# Patient Record
Sex: Female | Born: 1950
Health system: Southern US, Community
[De-identification: ages and names within clinical notes are randomized; demographics above are authoritative.]

## PROBLEM LIST (undated history)

## (undated) HISTORY — PX: CATARACT EXTRACTION: SUR2

## (undated) HISTORY — PX: RETINAL DETACHMENT SURGERY: SHX105

## (undated) HISTORY — PX: TONSILLECTOMY: SUR1361

---

## 1998-08-17 ENCOUNTER — Emergency Department (HOSPITAL_COMMUNITY): Admission: EM | Admit: 1998-08-17 | Discharge: 1998-08-17 | Payer: Self-pay | Admitting: Emergency Medicine

## 1999-06-15 ENCOUNTER — Inpatient Hospital Stay (HOSPITAL_COMMUNITY): Admission: RE | Admit: 1999-06-15 | Discharge: 1999-06-22 | Payer: Self-pay | Admitting: *Deleted

## 2004-09-26 ENCOUNTER — Encounter: Payer: Self-pay | Admitting: Internal Medicine

## 2004-09-26 LAB — CONVERTED CEMR LAB

## 2004-10-13 ENCOUNTER — Ambulatory Visit: Payer: Self-pay | Admitting: Internal Medicine

## 2004-10-20 ENCOUNTER — Ambulatory Visit: Payer: Self-pay | Admitting: Internal Medicine

## 2005-01-26 ENCOUNTER — Ambulatory Visit: Payer: Self-pay | Admitting: Internal Medicine

## 2005-01-31 ENCOUNTER — Ambulatory Visit: Payer: Self-pay

## 2005-02-07 ENCOUNTER — Ambulatory Visit: Payer: Self-pay | Admitting: Internal Medicine

## 2005-02-10 ENCOUNTER — Ambulatory Visit: Payer: Self-pay | Admitting: Cardiology

## 2005-02-25 ENCOUNTER — Ambulatory Visit: Payer: Self-pay | Admitting: Internal Medicine

## 2005-08-02 ENCOUNTER — Encounter: Admission: RE | Admit: 2005-08-02 | Discharge: 2005-08-02 | Payer: Self-pay | Admitting: Internal Medicine

## 2005-12-23 ENCOUNTER — Encounter: Admission: RE | Admit: 2005-12-23 | Discharge: 2005-12-23 | Payer: Self-pay | Admitting: Unknown Physician Specialty

## 2006-08-24 ENCOUNTER — Inpatient Hospital Stay (HOSPITAL_COMMUNITY): Admission: AD | Admit: 2006-08-24 | Discharge: 2006-08-30 | Payer: Self-pay | Admitting: Psychiatry

## 2006-08-24 ENCOUNTER — Emergency Department (HOSPITAL_COMMUNITY): Admission: EM | Admit: 2006-08-24 | Discharge: 2006-08-24 | Payer: Self-pay | Admitting: Emergency Medicine

## 2006-08-24 ENCOUNTER — Ambulatory Visit: Payer: Self-pay | Admitting: Psychiatry

## 2006-08-31 ENCOUNTER — Other Ambulatory Visit (HOSPITAL_COMMUNITY): Admission: RE | Admit: 2006-08-31 | Discharge: 2006-09-13 | Payer: Self-pay | Admitting: Psychiatry

## 2006-09-28 ENCOUNTER — Ambulatory Visit (HOSPITAL_COMMUNITY): Payer: Self-pay | Admitting: Psychiatry

## 2006-10-03 ENCOUNTER — Ambulatory Visit: Payer: Self-pay | Admitting: *Deleted

## 2006-10-03 ENCOUNTER — Emergency Department (HOSPITAL_COMMUNITY): Admission: EM | Admit: 2006-10-03 | Discharge: 2006-10-03 | Payer: Self-pay | Admitting: Emergency Medicine

## 2006-10-03 ENCOUNTER — Inpatient Hospital Stay (HOSPITAL_COMMUNITY): Admission: AD | Admit: 2006-10-03 | Discharge: 2006-10-09 | Payer: Self-pay | Admitting: *Deleted

## 2006-10-12 ENCOUNTER — Ambulatory Visit (HOSPITAL_COMMUNITY): Payer: Self-pay | Admitting: Psychiatry

## 2007-03-28 ENCOUNTER — Emergency Department (HOSPITAL_COMMUNITY): Admission: EM | Admit: 2007-03-28 | Discharge: 2007-03-28 | Payer: Self-pay | Admitting: Emergency Medicine

## 2007-07-17 ENCOUNTER — Encounter: Payer: Self-pay | Admitting: Internal Medicine

## 2007-07-17 DIAGNOSIS — R5381 Other malaise: Secondary | ICD-10-CM

## 2007-07-17 DIAGNOSIS — IMO0001 Reserved for inherently not codable concepts without codable children: Secondary | ICD-10-CM

## 2007-07-17 DIAGNOSIS — R5383 Other fatigue: Secondary | ICD-10-CM

## 2007-07-17 DIAGNOSIS — G4700 Insomnia, unspecified: Secondary | ICD-10-CM

## 2007-07-17 DIAGNOSIS — J309 Allergic rhinitis, unspecified: Secondary | ICD-10-CM | POA: Insufficient documentation

## 2007-07-17 DIAGNOSIS — F411 Generalized anxiety disorder: Secondary | ICD-10-CM | POA: Insufficient documentation

## 2007-07-17 DIAGNOSIS — I498 Other specified cardiac arrhythmias: Secondary | ICD-10-CM

## 2007-07-17 DIAGNOSIS — Z8719 Personal history of other diseases of the digestive system: Secondary | ICD-10-CM

## 2010-03-05 ENCOUNTER — Encounter (INDEPENDENT_AMBULATORY_CARE_PROVIDER_SITE_OTHER): Payer: Self-pay | Admitting: *Deleted

## 2010-10-26 NOTE — Letter (Signed)
Summary: Colonoscopy Date Change Letter  [facility]  [address] [place], [place] [address]   Phone: [phone]  Fax: [phone]      March 05, 2010 MRN: [id number]   [facility] [address] [place], [place]  [address]   Dear Ms. MITCHELL,   Previously you were recommended to have a repeat colonoscopy around this time. Your chart was recently reviewed by Dr. Claudette Head of [facility]. Follow up colonoscopy is now recommended in July 2014. This revised recommendation is based on current, nationally recognized guidelines for colorectal cancer screening and polyp surveillance. These guidelines are endorsed by the American Cancer Society, [employer] on Colorectal Cancer as well as numerous other major medical organizations.  Please understand that our recommendation assumes that you do not have any new symptoms such as bleeding, a change in bowel habits, anemia, or significant abdominal discomfort. If you do have any concerning GI symptoms or want to discuss the guideline recommendations, please call to arrange an office visit at your earliest convenience. Otherwise we will keep you in our reminder system and contact you 1-2 months prior to the date listed above to schedule your next colonoscopy.  Thank you,  Judie Petit T. Russella Dar, M.D.  [facility] [phone]

## 2011-02-08 NOTE — Consult Note (Signed)
NAMETALEEN, PROSSER             ACCOUNT NO.:  [id number]   MEDICAL RECORD NO.:  [id number]          PATIENT TYPE:  EMS   LOCATION:  ED                           FACILITY:  [facility]   PHYSICIAN:  Marolyn Hammock. Reynolds, M.D.DATE OF BIRTH:  11/12/1950   DATE OF CONSULTATION:  03/28/2007  DATE OF DISCHARGE:                                 CONSULTATION   EMERGENCY ROOM CONSULTATION   REQUESTING PHYSICIAN:  [facility].   REASON FOR EVALUATION:  Adverse drug reaction.   HISTORY OF PRESENT ILLNESS:  This is the initial emergency room consult  visit for this 60 year old woman with a past medical history, which  includes a psychotic episode for which she was hospitalized at the  [facility] in January.  Her family at the bedside reports that  she was discharged on medications including Risperdal, and over the last  few weeks this has been changed to Abilify.  The patient and family  reports that over the past few weeks she has gradually had more  difficulty with movement.  Her speech has become softer, and she has  developed some tremulousness.  She has also had some stiffness of the  extremities and some cramping in the legs.  She also developed  difficulty swallowing, which has been going on especially over the last  week or two.  These problems became particularly acute today, and she  was sent to the emergency room for evaluation.  Over the last couple of  weeks, her psychiatrist, Dr. Evelene Croon, has been gradually reducing her  Abilify and then she has actually been off the drug for a week.  She was  also started on Benztropine 1 mg b.i.d. a week ago, and the family  reports that this has improved the tremors but has not significantly  improved the stiffness, and if anything she has continued to have  difficulty swallowing, which is worsening a little bit.  The patient  denies any pain except for some occasional leg cramping.  Her moods have  been very stable over the last  couple of months.   PAST MEDICAL HISTORY:  1. The psychosis as above.  2. History of hypothyroidism for which she is on medications.   REVIEW OF SYSTEMS:  No headaches.  Some blurry vision as noted above.  Difficulty swallowing as noted above.  No recent chest pain, shortness  of breath, coughing, abdominal pain, nausea.   MEDICATIONS:  1. Benztropine 1 mg b.i.d.  2. Cymbalta 60 mg daily.  3. Klonopin 1 mg one-half tablet b.i.d. and one tablet q.h.s.  4. Armour-Thyroid.  5. Nexium.  6. She has recently discontinued Abilify as noted above.   PHYSICAL EXAMINATION:  VITAL SIGNS:  Temperature 97.4, blood pressure  119/60, pulse 74, respirations 20, O2 SAT 96% on room air.  GENERAL:  This is a healthy-appearing woman seen in no distress.  HEAD:  Atraumatic, normocephalic, oropharynx benign.  NECK:  Supple without carotid bruits.  HEART:  Regular rate and rhythm without murmurs.  NEUROLOGIC EXAMINATION:  Mental status:  She appears awake and alert.  She is able to answer  orientation questions without difficulty.  Recent  and remote memory are intact.  Concentration and fund of knowledge are  appropriate.  Speech is very soft but not dysarthric, and appropriate in  content.  Cranial nerves II-XII:  Pupils are equal and reactive.  Examination of extraocular movements reveals limited upgaze.  Visual  fields are full to threat.  She has decreased facial expression with  reduced blink, right.  The face and palate do move normally and  symmetrically.  Motor:  Normal bulk, there is increased tone with some  cogwheeling in the upper extremities, particularly on the left.  She has  normal strength in all tested extremity muscles.  Sensation:  Intact to  light touch in all extremities.  Coordination:  Rapid movements were  performed a little bit slowly.  Finger-to-nose performed accurately.  Gait:  She rises easily from the stretcher.  She ambulates with a  shortened stride and a bit of a  festinating gait.  Reflexes are 2+ and  symmetric, toes are downgoing bilaterally.   LABORATORY REVIEW:  Chemistries were remarkable only for a slightly  elevated glucose of 113.  A CBC is unremarkable.  A CT of the head is  personally reviewed and the study is unremarkable.   IMPRESSION:  Extrapyramidal reaction to antipsychotic medications.  She  is still having significant problems even after discontinuation of  medications over the last week and addition of Benztropine.   PLAN:  I discussed the situation with her psychiatrist, Dr. Evelene Croon.  She  is agreeable to undertaking Sinemet therapy.  I placed the patient on  Sinemet 25/100 one-half tablet t.i.d. with meals.  I advised her to  continue with the Cogentin and continue remaining off of the Abilify but  continuing the other psychiatric medications as before.  I asked her to  give me a call at the office in a few days, and the dose can be titrated  up as needed.  If her difficulty swallowing worsens or if she worsens in  any other way, she was advised to come back to the emergency department  for evaluation and possible admission.  She was also advised that at any  point at any time over the long upcoming weekend, she could call the  physician on call at [facility] for further  direction.  I thank you for the consultation.      Michael L. Thad Ranger, M.D.  Electronically Signed     MLR/MEDQ  D:  03/28/2007  T:  03/28/2007  Job:  [id number]   cc:   Milagros Evener, M.D.  Fax: [phone]
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
## 2011-02-11 NOTE — Discharge Summary (Signed)
NAMETANNER, VIGNA             ACCOUNT NO.:  [id number]   MEDICAL RECORD NO.:  [id number]          PATIENT TYPE:  IPS   LOCATION:  0306                          [facility]ILITY[facility]  [facility]   PHYSICIAN:  Anselm Jungling, MD  DATE OF BIRTH:  Aug 21, 1951   DATE OF ADMISSION:  08/24/2006  DATE OF DISCHARGE:  08/30/2006                               DISCHARGE SUMMARY   IDENTIFYING DATA AND REASON FOR ADMISSION:  This was an inpatient  psychiatric admission for Kendra Flynn, a 60 year old female who presented at  the emergency department with various physical complaints, associated  with self-inflicted cutting, a complaint that she just did not feel  right, and inability to contract for safety.  She was accepted for  admission based upon the likelihood of mood disorder and/or psychosis.  She had been hospitalized here seven years ago for reasons that were  unclear.  She had been on Celexa 20 mg.  She had no known history of  drug or alcohol abuse.   Please refer to the admission note for further details pertaining to the  symptoms, circumstances and history that led to her hospitalization.  She was given an initial Axis I diagnosis of depressive psychosis, rule  out.   MEDICAL AND LABORATORY:  The patient was medically and physically  assessed by the psychiatric nurse practitioner, and had been medically  cleared by the emergency department prior to admission to the inpatient  psychiatric service.  She was continued on Protonix 40 mg twice daily  for GERD and Armour Thyroid 30 mg daily for hypothyroidism.  There were  no acute medical issues.   HOSPITAL COURSE:  The patient was admitted to the adult inpatient  psychiatric service.  She presented as a slender woman who appeared  depressed, anxious and withdrawn.  Her responses were latent, brief, and  vague.  She was felt to be a poor historian.  We attempted to involve  her in therapeutic groups and activities.  She was continued on Celexa  20 mg  daily.  Although there were not many symptoms in the form of overt  psychosis or thought disorder presenting, her withdrawal was felt to be  concerning, and felt to be possibly a negative symptom of psychosis.  Because of this, she was begun on a trial of low-dose Risperdal.  With  this, the patient seemed to improve significantly.  By the fourth  hospital day she reported that she was feeling better, eating better,  and having less of a nearly catatonic appearance.  However she still  remained somewhat blunted, flat.  However, she was more verbal and more  spontaneous.  She continued to deny auditory hallucinations and there  were no delusional statements.  Following this, the patient continued to  improve daily with more well-organized, realistic thinking, more smiling  affect, and more ability to participate in the treatment program.  On  the sixth hospital day there was a family meeting involving the patient  and her husband.  In that meeting she denied any suicidal ideation and  indicated that she realized that she needed to relax more and take  her  medications regularly.  Husband indicated that he wanted help the  patient in every way possible.  Husband and the patient discussed the  possibility of her volunteering with the elderly because that seems to  relax the patient and keep her active in the community.  Husband stated  that he would like to have written instructions about the patient's  medications upon discharge and any restrictions the patient may have.  They discussed aftercare with a psychiatrist on an outpatient basis.  They also discussed the possibility of counseling.  They described a  strong support system already in place for the patient.  They reviewed a  suicide prevention and crisis hotline pamphlet.   Following this, the patient was felt to be appropriate for discharge.  She agreed to the following aftercare plan.   AFTERCARE:  The patient was to start the very  next day in the [facility]  intensive outpatient psychiatric program.  There, she will be further  followed by Dr. Dub Mikes, a program psychiatrist, regarding medication.   DISCHARGE MEDICATIONS:  Celexa 20 mg daily, Protonix 40 mg b.i.d.,  Armour thyroid 30 mg daily, Risperdal 1 mg b.i.d., and Ativan 1 mg  b.i.d..   DISCHARGE DIAGNOSES:  AXIS I: Depressive disorder with psychotic  features.  AXIS II: Deferred.  AXIS III: History of gastroesophageal reflux disease and hypothyroidism.  AXIS IV: Stressors, severe.  AXIS V: GAF on discharge 60.      Anselm Jungling, MD  Electronically Signed     SPB/MEDQ  D:  10/04/2006  T:  10/05/2006  Job:  [id number]

## 2011-05-31 ENCOUNTER — Other Ambulatory Visit: Payer: Self-pay | Admitting: Ophthalmology

## 2011-05-31 DIAGNOSIS — H5710 Ocular pain, unspecified eye: Secondary | ICD-10-CM

## 2011-05-31 DIAGNOSIS — S0530XA Ocular laceration without prolapse or loss of intraocular tissue, unspecified eye, initial encounter: Secondary | ICD-10-CM

## 2011-06-01 ENCOUNTER — Ambulatory Visit
Admission: RE | Admit: 2011-06-01 | Discharge: 2011-06-01 | Disposition: A | Discharge status: Home / Self Care | Payer: BC Managed Care – PPO | Source: Ambulatory Visit | Attending: Ophthalmology | Admitting: Ophthalmology

## 2011-06-01 DIAGNOSIS — S0530XA Ocular laceration without prolapse or loss of intraocular tissue, unspecified eye, initial encounter: Secondary | ICD-10-CM

## 2011-06-01 MED ORDER — GADOBENATE DIMEGLUMINE 529 MG/ML IV SOLN
15.0000 mL | Freq: Once | INTRAVENOUS | Status: AC | PRN
  Administered 2011-06-01: 15 mL via INTRAVENOUS

## 2011-07-12 LAB — BASIC METABOLIC PANEL
BUN: 2 — ABNORMAL LOW
CO2: 31
Calcium: 9.7
Chloride: 104
Creatinine, Ser: 0.71
GFR calc Af Amer: >60
GFR calc non Af Amer: >60
Glucose, Bld: 113 — ABNORMAL HIGH
Potassium: 4
Sodium: 141

## 2011-07-12 LAB — CBC
HCT: 40.6
Hemoglobin: 13.8
MCHC: 33.9
MCV: 84.4
Platelets: 261
RBC: 4.81
RDW: 14.3 — ABNORMAL HIGH
WBC: 5.4

## 2012-05-21 ENCOUNTER — Other Ambulatory Visit: Payer: Self-pay | Admitting: Family Medicine

## 2012-05-21 ENCOUNTER — Other Ambulatory Visit (HOSPITAL_COMMUNITY)
Admission: RE | Admit: 2012-05-21 | Discharge: 2012-05-21 | Disposition: A | Discharge status: Home / Self Care | Payer: BC Managed Care – PPO | Source: Ambulatory Visit | Attending: Family Medicine | Admitting: Family Medicine

## 2012-05-21 DIAGNOSIS — Z0000 Encounter for general adult medical examination without abnormal findings: Secondary | ICD-10-CM | POA: Insufficient documentation

## 2012-05-23 ENCOUNTER — Other Ambulatory Visit: Payer: Self-pay | Admitting: Obstetrics and Gynecology

## 2012-05-23 ENCOUNTER — Other Ambulatory Visit: Payer: Self-pay | Admitting: Family Medicine

## 2012-05-23 DIAGNOSIS — Z1231 Encounter for screening mammogram for malignant neoplasm of breast: Secondary | ICD-10-CM

## 2012-06-15 ENCOUNTER — Ambulatory Visit
Admission: RE | Admit: 2012-06-15 | Discharge: 2012-06-15 | Disposition: A | Discharge status: Home / Self Care | Payer: BC Managed Care – PPO | Source: Ambulatory Visit | Attending: Family Medicine | Admitting: Family Medicine

## 2012-06-15 DIAGNOSIS — Z1231 Encounter for screening mammogram for malignant neoplasm of breast: Secondary | ICD-10-CM

## 2013-04-17 ENCOUNTER — Encounter: Payer: Self-pay | Admitting: Gastroenterology

## 2013-04-19 ENCOUNTER — Encounter: Payer: Self-pay | Admitting: Gastroenterology

## 2013-05-28 ENCOUNTER — Ambulatory Visit (AMBULATORY_SURGERY_CENTER): Payer: BC Managed Care – PPO

## 2013-05-28 VITALS — Ht 65.5 in | Wt 185.4 lb

## 2013-05-28 DIAGNOSIS — Z860100 Personal history of colon polyps, unspecified: Secondary | ICD-10-CM

## 2013-05-28 MED ORDER — SOD PICOSULFATE-MAG OX-CIT ACD 10-3.5-12 MG-GM-GM PO PACK: 1.0000 | PACK | Freq: Once | ORAL | Status: DC

## 2013-05-28 NOTE — Addendum Note (Signed)
Addended by: Doristine Church F on: 05/28/2013 03:15 PM   Modules accepted: Orders

## 2013-05-29 ENCOUNTER — Encounter: Payer: Self-pay | Admitting: Gastroenterology

## 2013-06-05 ENCOUNTER — Telehealth: Payer: Self-pay | Admitting: Gastroenterology

## 2013-06-05 NOTE — Telephone Encounter (Signed)
All the bowel preps we use are similar to mag citrate. Mag citrate alone is not acceptable and mag citrate is also a chemical. She will need to pick one of our recommended bowel preps. That all I can offer.

## 2013-06-05 NOTE — Telephone Encounter (Signed)
Pt states that she has per her choice placed her self on an organic diet for her weight , health etc. She is VERY adamant about not wanting to drink a " chemical prep", she doesn't want to put that in her body and she wants to do a special liquid  diet that she will follow that will be 2-3 days  Before the procedure with broths, water, clear liquids ,etc and she ONLY wants to do mag citrate as bowel prep. Pt states she will fall back on epsom salt to cleanse her out if necessary. . She doesn't want to do the chemical prep. Pt states eating organic only x 1 year now, nothing chemical or processed. Pt states she knows what not to eat for this procedure as she had a pre visit  and she just wants a natural alternative to the prep.  Pt states she does want to have the colon done, she is in great health but she will not drink the prepopik or any chemical prep. She states she will not hesitate to cancel this procedure if she cannot do the mag citrate as this should be a patient choice. Please advise.  Thanks, marie Pre visit

## 2013-06-05 NOTE — Telephone Encounter (Signed)
called pt and lm to return call about her prep. ewm 06-05-13  222pm.

## 2013-06-06 NOTE — Telephone Encounter (Signed)
Ms. Kendra Flynn phoned in at 1109 this morning to say after thinking about the prep and the wonderful care M. McCraw had provided her, she was going to proceed on with her procedure as scheduled and use the prep that was prescribed for her, Prep-o-pik. She denied any further questions and said she looked forward to getting this procedure completed and behind her. Informed her that if she had any further questions or concerns to call us and we would be happy to assist her./TE

## 2013-06-12 ENCOUNTER — Ambulatory Visit (AMBULATORY_SURGERY_CENTER): Payer: BC Managed Care – PPO | Admitting: Gastroenterology

## 2013-06-12 ENCOUNTER — Encounter: Payer: Self-pay | Admitting: Gastroenterology

## 2013-06-12 VITALS — BP 115/66 | HR 56 | Temp 97.3°F | Resp 14 | Ht 65.0 in | Wt 185.0 lb

## 2013-06-12 DIAGNOSIS — Z1211 Encounter for screening for malignant neoplasm of colon: Secondary | ICD-10-CM

## 2013-06-12 DIAGNOSIS — Z86010 Personal history of colonic polyps: Secondary | ICD-10-CM

## 2013-06-12 MED ORDER — SODIUM CHLORIDE 0.9 % IV SOLN: 500.0000 mL | INTRAVENOUS | Status: DC

## 2013-06-12 NOTE — Patient Instructions (Addendum)
Impressions/recommendations:  Normal colonoscopy  Repeat colonoscopy in 10 years.  YOU HAD AN ENDOSCOPIC PROCEDURE TODAY AT THE [facility]: Refer to the procedure report that was given to you for any specific questions about what was found during the examination.  If the procedure report does not answer your questions, please call your gastroenterologist to clarify.  If you requested that your care partner not be given the details of your procedure findings, then the procedure report has been included in a sealed envelope for you to review at your convenience later.  YOU SHOULD EXPECT: Some feelings of bloating in the abdomen. Passage of more gas than usual.  Walking can help get rid of the air that was put into your GI tract during the procedure and reduce the bloating. If you had a lower endoscopy (such as a colonoscopy or flexible sigmoidoscopy) you may notice spotting of blood in your stool or on the toilet paper. If you underwent a bowel prep for your procedure, then you may not have a normal bowel movement for a few days.  DIET: Your first meal following the procedure should be a light meal and then it is ok to progress to your normal diet.  A half-sandwich or bowl of soup is an example of a good first meal.  Heavy or fried foods are harder to digest and may make you feel nauseous or bloated.  Likewise meals heavy in dairy and vegetables can cause extra gas to form and this can also increase the bloating.  Drink plenty of fluids but you should avoid alcoholic beverages for 24 hours.  ACTIVITY: Your care partner should take you home directly after the procedure.  You should plan to take it easy, moving slowly for the rest of the day.  You can resume normal activity the day after the procedure however you should NOT DRIVE or use heavy machinery for 24 hours (because of the sedation medicines used during the test).    SYMPTOMS TO REPORT IMMEDIATELY: A gastroenterologist can be reached  at any hour.  During normal business hours, 8:30 AM to 5:00 PM Monday through Friday, call [phone].  After hours and on weekends, please call the GI answering service at [phone] who will take a message and have the physician on call contact you.   Following lower endoscopy (colonoscopy or flexible sigmoidoscopy):  Excessive amounts of blood in the stool  Significant tenderness or worsening of abdominal pains  Swelling of the abdomen that is new, acute  Fever of 100F or higher   FOLLOW UP: If any biopsies were taken you will be contacted by phone or by letter within the next 1-3 weeks.  Call your gastroenterologist if you have not heard about the biopsies in 3 weeks.  Our staff will call the home number listed on your records the next business day following your procedure to check on you and address any questions or concerns that you may have at that time regarding the information given to you following your procedure. This is a courtesy call and so if there is no answer at the home number and we have not heard from you through the emergency physician on call, we will assume that you have returned to your regular daily activities without incident.  SIGNATURES/CONFIDENTIALITY: You and/or your care partner have signed paperwork which will be entered into your electronic medical record.  These signatures attest to the fact that that the information above on your After Visit Summary has been   reviewed and is understood.  Full responsibility of the confidentiality of this discharge information lies with you and/or your care-partner. 

## 2013-06-12 NOTE — Progress Notes (Signed)
Patient did not experience any of the following events: a burn prior to discharge; a fall within the facility; wrong site/side/patient/procedure/implant event; or a hospital transfer or hospital admission upon discharge from the facility. (G8907) Patient did not have preoperative order for IV antibiotic SSI prophylaxis. (G8918)  

## 2013-06-12 NOTE — Op Note (Signed)
[facility] [address] [place], [address]   COLONOSCOPY PROCEDURE REPORT  PATIENT: Kendra Flynn, Kendra Flynn  MR#: [id number] BIRTHDATE: November 02, 1950 , 62  yrs. old GENDER: Female ENDOSCOPIST: Meryl Dare, MD, [facility] PROCEDURE DATE:  06/12/2013 PROCEDURE:   Colonoscopy, screening First Screening Colonoscopy - Avg.  risk and is 50 yrs.  old or older - No.  Prior Negative Screening - Now for repeat screening. 10 or more years since last screening  History of Adenoma - Now for follow-up colonoscopy & has been > or = to 3 yrs.  N/A  Polyps Removed Today? No.  Recommend repeat exam, <10 yrs? No. ASA CLASS:   Class II INDICATIONS:average risk screening. MEDICATIONS: MAC sedation, administered by CRNA and propofol (Diprivan) 150mg  IV DESCRIPTION OF PROCEDURE:   After the risks benefits and alternatives of the procedure were thoroughly explained, informed consent was obtained.  A digital rectal exam revealed no abnormalities of the rectum.   The LB [id number]  endoscope was introduced through the anus and advanced to the cecum, which was identified by both the appendix and ileocecal valve. No adverse events experienced.  The quality of the prep was Prepopik adequate. Extensive rinsin and suctioning needed to acheive an adequate prep. The instrument was then slowly withdrawn as the colon was fully examined.  COLON FINDINGS: A normal appearing cecum, ileocecal valve, and appendiceal orifice were identified.  The ascending, hepatic flexure, transverse, splenic flexure, descending, sigmoid colon and rectum appeared unremarkable.  No polyps or cancers were seen. Retroflexed views revealed no abnormalities. The time to cecum=4 minutes 30 seconds.  Withdrawal time=12 minutes 07 seconds.  The scope was withdrawn and the procedure completed.  COMPLICATIONS: There were no complications.  ENDOSCOPIC IMPRESSION: 1.  Normal colon  RECOMMENDATIONS: 1.  Continue current  colorectal screening recommendations for "routine risk" patients with a repeat colonoscopy in 10 years with a more extensive bowel prep.   eSigned:  Meryl Dare, MD, [facility] 06/12/2013 8:48 AM   cc: Merri Brunette, MD

## 2013-06-12 NOTE — Progress Notes (Signed)
Lidocaine-40mg IV prior to Propofol InductionPropofol given over incremental dosages 

## 2013-06-13 ENCOUNTER — Telehealth: Payer: Self-pay | Admitting: *Deleted

## 2013-06-13 NOTE — Telephone Encounter (Signed)
No answer left message to call if questions or concerns. 

## 2013-06-25 ENCOUNTER — Other Ambulatory Visit: Payer: Self-pay

## 2013-06-25 DIAGNOSIS — Z1231 Encounter for screening mammogram for malignant neoplasm of breast: Secondary | ICD-10-CM

## 2013-07-25 ENCOUNTER — Ambulatory Visit
Admission: RE | Admit: 2013-07-25 | Discharge: 2013-07-25 | Disposition: A | Discharge status: Home / Self Care | Payer: BC Managed Care – PPO | Source: Ambulatory Visit

## 2013-07-25 DIAGNOSIS — Z1231 Encounter for screening mammogram for malignant neoplasm of breast: Secondary | ICD-10-CM

## 2013-10-23 ENCOUNTER — Emergency Department (HOSPITAL_COMMUNITY)
Admission: EM | Admit: 2013-10-23 | Discharge: 2013-10-23 | Disposition: A | Discharge status: Home / Self Care | Payer: BC Managed Care – PPO | Attending: Emergency Medicine | Admitting: Emergency Medicine

## 2013-10-23 ENCOUNTER — Emergency Department (HOSPITAL_COMMUNITY): Payer: BC Managed Care – PPO

## 2013-10-23 ENCOUNTER — Encounter (HOSPITAL_COMMUNITY): Payer: Self-pay | Admitting: Emergency Medicine

## 2013-10-23 DIAGNOSIS — W010XXA Fall on same level from slipping, tripping and stumbling without subsequent striking against object, initial encounter: Secondary | ICD-10-CM | POA: Insufficient documentation

## 2013-10-23 DIAGNOSIS — S8002XA Contusion of left knee, initial encounter: Secondary | ICD-10-CM

## 2013-10-23 DIAGNOSIS — S0180XA Unspecified open wound of other part of head, initial encounter: Secondary | ICD-10-CM | POA: Insufficient documentation

## 2013-10-23 DIAGNOSIS — Z880 Allergy status to penicillin: Secondary | ICD-10-CM | POA: Insufficient documentation

## 2013-10-23 DIAGNOSIS — W108XXA Fall (on) (from) other stairs and steps, initial encounter: Secondary | ICD-10-CM | POA: Insufficient documentation

## 2013-10-23 DIAGNOSIS — H5702 Anisocoria: Secondary | ICD-10-CM | POA: Insufficient documentation

## 2013-10-23 DIAGNOSIS — S42002A Fracture of unspecified part of left clavicle, initial encounter for closed fracture: Secondary | ICD-10-CM

## 2013-10-23 DIAGNOSIS — S8000XA Contusion of unspecified knee, initial encounter: Secondary | ICD-10-CM | POA: Insufficient documentation

## 2013-10-23 DIAGNOSIS — S42033A Displaced fracture of lateral end of unspecified clavicle, initial encounter for closed fracture: Secondary | ICD-10-CM | POA: Insufficient documentation

## 2013-10-23 DIAGNOSIS — S0181XA Laceration without foreign body of other part of head, initial encounter: Secondary | ICD-10-CM

## 2013-10-23 DIAGNOSIS — Y92009 Unspecified place in unspecified non-institutional (private) residence as the place of occurrence of the external cause: Secondary | ICD-10-CM | POA: Insufficient documentation

## 2013-10-23 DIAGNOSIS — S20219A Contusion of unspecified front wall of thorax, initial encounter: Secondary | ICD-10-CM

## 2013-10-23 DIAGNOSIS — Z79899 Other long term (current) drug therapy: Secondary | ICD-10-CM | POA: Insufficient documentation

## 2013-10-23 DIAGNOSIS — Y9389 Activity, other specified: Secondary | ICD-10-CM | POA: Insufficient documentation

## 2013-10-23 MED ORDER — OXYCODONE-ACETAMINOPHEN 5-325 MG PO TABS
1.0000 | ORAL_TABLET | Freq: Once | ORAL | Status: AC
  Administered 2013-10-23: 1 via ORAL
  Filled 2013-10-23: qty 1

## 2013-10-23 MED ORDER — IBUPROFEN 200 MG PO TABS
600.0000 mg | ORAL_TABLET | Freq: Once | ORAL | Status: AC
  Administered 2013-10-23: 600 mg via ORAL
  Filled 2013-10-23: qty 3

## 2013-10-23 MED ORDER — OXYCODONE-ACETAMINOPHEN 5-325 MG PO TABS: 1.0000 | ORAL_TABLET | ORAL | Status: AC | PRN

## 2013-10-23 NOTE — ED Provider Notes (Signed)
CSN: [id number]     Arrival date & time 10/23/13  1410 History   First MD Initiated Contact with Patient 10/23/13 1500     Chief Complaint  Patient presents with  . Fall    forehead lac./left knee and shoulder pain   (Consider location/radiation/quality/duration/timing/severity/associated sxs/prior Treatment) HPI  63 year old female presenting after fall. Patient tripped while carrying objects coming down some steps. She had been on naproxen last 2-3 steps. She did check her head against a wall in front of her. No loss of consciousness. His pain complaining of pain primarily in her left shoulder the left chest wall. Denies any shortness of breath. No neck or back pain. Some mild pain in her left knee. No numbers or tingling. No use of blood thinning medicines. No pain medicine prior to arrival.  History reviewed. No pertinent past medical history. Past Surgical History  Procedure Laterality Date  . Retinal detachment surgery      left eye 05/27/11  . Cataract extraction      left eye 01/16/12  . Tonsillectomy      1965   Family History  Problem Relation Age of Onset  . Colon cancer Neg Hx   . Pancreatic cancer Neg Hx   . Rectal cancer Neg Hx   . Stomach cancer Neg Hx    History  Substance Use Topics  . Smoking status: Never Smoker   . Smokeless tobacco: Never Used  . Alcohol Use: No   OB History   Grav Para Term Preterm Abortions TAB SAB Ect Mult Living                 Review of Systems  All systems reviewed and negative, other than as noted in HPI.   Allergies  Amoxicillin  Home Medications   Current Outpatient Rx  Name  Route  Sig  Dispense  Refill  . buPROPion (WELLBUTRIN SR) 150 MG 12 hr tablet   Oral   Take 150 mg by mouth daily.         . DULoxetine (CYMBALTA) 60 MG capsule   Oral   Take 60 mg by mouth daily.          BP 125/61  Pulse 59  Temp(Src) 98.5 F (36.9 C) (Oral)  Resp 16  SpO2 99% Physical Exam  Nursing note and vitals  reviewed. Constitutional: She appears well-developed and well-nourished. No distress.  HENT:  Head: Normocephalic and atraumatic.    2 lacerations to the center of the for head. One of the right side is approximately 1 cm in total length. The one on the left side is approximately 2 cm in total length and mildly gaping. No active bleeding. No facial tenderness. No epistaxis or septal hematoma. Oropharynx is clear. Dentition intact.  Eyes: Conjunctivae are normal. Right eye exhibits no discharge. Left eye exhibits no discharge.  Anisocoria which pt reports is chronic.   Neck: Neck supple.  Cardiovascular: Normal rate, regular rhythm and normal heart sounds.  Exam reveals no gallop and no friction rub.   No murmur heard. Pulmonary/Chest: Effort normal and breath sounds normal. No respiratory distress.  Tenderness to palpation left anterior chest wall extending into the left axilla. No crepitus. Overlying skin changes. Lung sounds were symmetric bilaterally with good air movement.  Abdominal: Soft. She exhibits no distension. There is no tenderness.  Musculoskeletal: She exhibits no edema and no tenderness.  No midline spinal tenderness. Tenderness over L distal clavicle/AC joint.  The humeral head appears to be  appropriately positioned on palpation. Pain with range of motion of the left shoulder particularly external rotation and adduction. Neurovascular intact distally. Mild tenderness over the left patella. No severe pain range of motion of the left knee.  Neurological: She is alert.  Transverse intact aside from the aforementioned pupillary defect. Strength is 5 out of 5 right upper extremity and bilateral lower extremities. Unable to assess left upper extremity adequately secondary to pain. Sensation is intact to light touch.  Skin: Skin is warm and dry.  Psychiatric: She has a normal mood and affect. Her behavior is normal. Thought content normal.    ED Course  Procedures (including  critical care time)  LACERATION REPAIR Performed by: Raeford Razor Authorized by: Raeford Razor Consent: Verbal consent obtained. Risks and benefits: risks, benefits and alternatives were discussed Consent given by: patient Patient identity confirmed: provided demographic data Prepped and Draped in normal sterile fashion Wound explored  Laceration Location: forehead  Laceration Length: R 1 cm. L; 2.5cm  No Foreign Bodies seen or palpated  Anesthesia: local infiltration  Local anesthetic: lidocaine 2% w/ epinephrine  Anesthetic total: 2 ml  Irrigation method: syringe Amount of cleaning: standard  Skin closure: r dermabond, L 6-0 prolene  Number of sutures: 5  Technique: simple interupted  Patient tolerance: Patient tolerated the procedure well with no immediate complications. Labs Review Labs Reviewed - No data to display Imaging Review Dg Ribs Unilateral W/chest Left  10/23/2013   CLINICAL DATA:  Fall  EXAM: LEFT RIBS AND CHEST - 3+ VIEW  COMPARISON:  None.  FINDINGS: Displaced fracture distal left clavicle.  Negative for left rib fracture.  The lungs are clear without infiltrate effusion or pneumothorax.  IMPRESSION: Fracture left clavicle  Negative for left rib fracture.   Electronically Signed   By: Marlan Palau M.D.   On: 10/23/2013 16:19   Dg Shoulder Left  10/23/2013   CLINICAL DATA:  Fall  EXAM: LEFT SHOULDER - 2+ VIEW  COMPARISON:  08/02/2005  FINDINGS: Fracture of the distal left clavicle with superior displacement of the distal fragment. AC joint appears intact. Glenohumeral joint is normal.  IMPRESSION: Displaced fracture distal left clavicle.   Electronically Signed   By: Marlan Palau M.D.   On: 10/23/2013 16:13    EKG Interpretation   None       MDM   1. Closed fracture of left clavicle   2. Laceration of forehead   3. Chest wall contusion   4. Contusion of left knee     63 year old female with left shoulder and left chest pain after  mechanical fall. We'll x-ray to eval for acute osseous injury. For head lacerations which will be repaired. Patient is not complaining of any significant headache, she is on any blood thinning medication she has a nonfocal neurological examination. She has no midline spinal tenderness. Do not feel that she needs neuroimaging. Pain medication he continued monitoring while in the emergency room.  Imaging significant for a distal left clavicle fracture. Closed injury. Neurovascularly intact. Sling. As needed pain medication. Ortho followup. Lacerations repaired. Continued wound care was discussed. Outpatient followup as needed and for suture removal.    Raeford Razor, MD 10/23/13 [phone]

## 2013-10-23 NOTE — ED Notes (Signed)
Bed: [id number] Expected date:  Expected time:  Means of arrival:  Comments: ems-fall/laceration
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
## 2013-10-23 NOTE — ED Notes (Signed)
Suture cart at bedside 

## 2013-10-23 NOTE — ED Notes (Signed)
She thanks us for our care.  Dr. Juleen ChinaKohut has sutured and glued the 2cm vertical lac. At forehead.  She is in no distress.

## 2013-10-23 NOTE — ED Notes (Signed)
She states she tripped while descending a staircase at her apartment--fell ~2-3 stairs.  She states she fell as she was carrying things with both arms; tripped-did not pass out.  She denies l.o.c. And is awake, alert and oriented x 4.  She has a lac. At left mid-forehead area which has a dry dressing on it.  She also c/o left shoulder area pain and some mild left knee pain.

## 2015-02-05 ENCOUNTER — Other Ambulatory Visit: Payer: Self-pay | Admitting: Optometry

## 2015-02-05 DIAGNOSIS — H401232 Low-tension glaucoma, bilateral, moderate stage: Secondary | ICD-10-CM

## 2015-02-17 ENCOUNTER — Ambulatory Visit
Admission: RE | Admit: 2015-02-17 | Discharge: 2015-02-17 | Disposition: A | Discharge status: Home / Self Care | Payer: Medicare Other | Source: Ambulatory Visit | Attending: Optometry | Admitting: Optometry

## 2015-02-17 DIAGNOSIS — H401232 Low-tension glaucoma, bilateral, moderate stage: Secondary | ICD-10-CM

## 2015-02-17 MED ORDER — GADOBENATE DIMEGLUMINE 529 MG/ML IV SOLN: 14.0000 mL | Freq: Once | INTRAVENOUS | Status: AC | PRN

## 2015-07-31 ENCOUNTER — Encounter: Payer: Self-pay | Admitting: Gastroenterology

## 2015-12-02 ENCOUNTER — Other Ambulatory Visit: Payer: Self-pay | Admitting: Family Medicine

## 2015-12-02 ENCOUNTER — Other Ambulatory Visit (HOSPITAL_COMMUNITY)
Admission: RE | Admit: 2015-12-02 | Discharge: 2015-12-02 | Disposition: A | Discharge status: Home / Self Care | Payer: Medicare Other | Source: Ambulatory Visit | Attending: Family Medicine | Admitting: Family Medicine

## 2015-12-02 DIAGNOSIS — Z124 Encounter for screening for malignant neoplasm of cervix: Secondary | ICD-10-CM | POA: Insufficient documentation

## 2015-12-09 LAB — CYTOLOGY - PAP

## 2016-05-05 ENCOUNTER — Other Ambulatory Visit: Payer: Self-pay | Admitting: Family Medicine

## 2016-05-05 DIAGNOSIS — Z1231 Encounter for screening mammogram for malignant neoplasm of breast: Secondary | ICD-10-CM

## 2016-05-09 ENCOUNTER — Ambulatory Visit
Admission: RE | Admit: 2016-05-09 | Discharge: 2016-05-09 | Disposition: A | Discharge status: Home / Self Care | Payer: Medicare Other | Source: Ambulatory Visit | Attending: Family Medicine | Admitting: Family Medicine

## 2016-05-09 DIAGNOSIS — Z1231 Encounter for screening mammogram for malignant neoplasm of breast: Secondary | ICD-10-CM

## 2018-01-22 ENCOUNTER — Other Ambulatory Visit: Payer: Self-pay | Admitting: Family Medicine

## 2018-01-22 DIAGNOSIS — Z1231 Encounter for screening mammogram for malignant neoplasm of breast: Secondary | ICD-10-CM

## 2018-02-15 ENCOUNTER — Ambulatory Visit
Admission: RE | Admit: 2018-02-15 | Discharge: 2018-02-15 | Disposition: A | Discharge status: Home / Self Care | Payer: Medicare Other | Source: Ambulatory Visit | Attending: Family Medicine | Admitting: Family Medicine

## 2018-02-15 DIAGNOSIS — Z1231 Encounter for screening mammogram for malignant neoplasm of breast: Secondary | ICD-10-CM

## 2019-02-19 ENCOUNTER — Other Ambulatory Visit: Payer: Self-pay | Admitting: Family Medicine

## 2019-02-19 DIAGNOSIS — Z1231 Encounter for screening mammogram for malignant neoplasm of breast: Secondary | ICD-10-CM

## 2019-12-11 ENCOUNTER — Other Ambulatory Visit: Payer: Self-pay | Admitting: Family Medicine

## 2019-12-11 DIAGNOSIS — E2839 Other primary ovarian failure: Secondary | ICD-10-CM

## 2019-12-11 DIAGNOSIS — Z1231 Encounter for screening mammogram for malignant neoplasm of breast: Secondary | ICD-10-CM

## 2020-01-15 ENCOUNTER — Other Ambulatory Visit: Payer: Self-pay | Admitting: Family Medicine

## 2020-01-15 DIAGNOSIS — E2839 Other primary ovarian failure: Secondary | ICD-10-CM

## 2020-01-17 ENCOUNTER — Ambulatory Visit
Admission: RE | Admit: 2020-01-17 | Discharge: 2020-01-17 | Disposition: A | Discharge status: Home / Self Care | Payer: Medicare PPO | Source: Ambulatory Visit | Attending: Family Medicine | Admitting: Family Medicine

## 2020-01-17 ENCOUNTER — Other Ambulatory Visit: Payer: Self-pay

## 2020-01-17 DIAGNOSIS — Z1231 Encounter for screening mammogram for malignant neoplasm of breast: Secondary | ICD-10-CM

## 2020-01-17 DIAGNOSIS — E2839 Other primary ovarian failure: Secondary | ICD-10-CM

## 2020-02-27 DIAGNOSIS — H401133 Primary open-angle glaucoma, bilateral, severe stage: Secondary | ICD-10-CM | POA: Diagnosis not present

## 2020-03-10 DIAGNOSIS — R1312 Dysphagia, oropharyngeal phase: Secondary | ICD-10-CM | POA: Diagnosis not present

## 2020-05-28 ENCOUNTER — Encounter (INDEPENDENT_AMBULATORY_CARE_PROVIDER_SITE_OTHER): Payer: Medicare Other | Admitting: Ophthalmology

## 2020-06-04 ENCOUNTER — Encounter (INDEPENDENT_AMBULATORY_CARE_PROVIDER_SITE_OTHER): Payer: Medicare Other | Admitting: Ophthalmology

## 2020-06-18 ENCOUNTER — Encounter (INDEPENDENT_AMBULATORY_CARE_PROVIDER_SITE_OTHER): Payer: Medicare PPO | Admitting: Ophthalmology

## 2020-06-23 ENCOUNTER — Ambulatory Visit (INDEPENDENT_AMBULATORY_CARE_PROVIDER_SITE_OTHER): Payer: Medicare PPO | Admitting: Ophthalmology

## 2020-06-23 ENCOUNTER — Encounter (INDEPENDENT_AMBULATORY_CARE_PROVIDER_SITE_OTHER): Payer: Self-pay | Admitting: Ophthalmology

## 2020-06-23 ENCOUNTER — Other Ambulatory Visit: Payer: Self-pay

## 2020-06-23 DIAGNOSIS — H401132 Primary open-angle glaucoma, bilateral, moderate stage: Secondary | ICD-10-CM | POA: Diagnosis not present

## 2020-06-23 DIAGNOSIS — H43811 Vitreous degeneration, right eye: Secondary | ICD-10-CM | POA: Diagnosis not present

## 2020-06-23 DIAGNOSIS — Z8669 Personal history of other diseases of the nervous system and sense organs: Secondary | ICD-10-CM | POA: Diagnosis not present

## 2020-06-23 DIAGNOSIS — Z961 Presence of intraocular lens: Secondary | ICD-10-CM | POA: Insufficient documentation

## 2020-06-23 NOTE — Assessment & Plan Note (Signed)

## 2020-06-23 NOTE — Progress Notes (Signed)
06/23/2020     CHIEF COMPLAINT Patient presents for Retina Follow Up   HISTORY OF PRESENT ILLNESS: Kendra Flynn is a 69 y.o. female who presents to the clinic today for:   HPI    Retina Follow Up    Patient presents with  Other.  In both eyes.  This started 2 years ago.  Severity is mild.  Duration of 2 years.  Since onset it is stable.          Comments    2 Year F/U OU  Pt denies noticeable changes to [place] OU since last visit. Pt denies ocular pain, flashes of light, or floaters OU.         Last edited by Ileana Roup, COA on 06/23/2020  2:12 PM. (History)      Referring physician: Merri Brunette, MD [phone] [place],  [place] [address]  HISTORICAL INFORMATION:   Selected notes from the MEDICAL RECORD NUMBER       CURRENT MEDICATIONS: Current Outpatient Medications (Ophthalmic Drugs)  Medication Sig  . dorzolamide-timolol (COSOPT) 22.3-6.8 MG/ML ophthalmic solution   . latanoprost (XALATAN) 0.005 % ophthalmic solution    No current facility-administered medications for this visit. (Ophthalmic Drugs)   Current Outpatient Medications (Other)  Medication Sig  . buPROPion (WELLBUTRIN SR) 150 MG 12 hr tablet Take 150 mg by mouth daily.  . DULoxetine (CYMBALTA) 60 MG capsule Take 60 mg by mouth daily.  Marland Kitchen oxyCODONE-acetaminophen (PERCOCET/ROXICET) 5-325 MG per tablet Take 1-2 tablets by mouth every 4 (four) hours as needed for severe pain.   No current facility-administered medications for this visit. (Other)      REVIEW OF SYSTEMS:    ALLERGIES Allergies  Allergen Reactions  . Amoxicillin     REACTION: vomiting    PAST MEDICAL HISTORY History reviewed. No pertinent past medical history. Past Surgical History:  Procedure Laterality Date  . CATARACT EXTRACTION     left eye 01/16/12  . RETINAL DETACHMENT SURGERY     left eye 05/27/11  . TONSILLECTOMY     1965    FAMILY HISTORY Family History  Problem Relation Age of Onset    . Colon cancer Neg Hx   . Pancreatic cancer Neg Hx   . Rectal cancer Neg Hx   . Stomach cancer Neg Hx     SOCIAL HISTORY Social History   Tobacco Use  . Smoking status: Never Smoker  . Smokeless tobacco: Never Used  Substance Use Topics  . Alcohol use: No  . Drug use: No         OPHTHALMIC EXAM: Base Eye Exam    Visual Acuity (ETDRS)      Right Left   Dist [place] 20/20 20/50 +2   Dist ph [place]  20/30 +2  Pt forgot glasses       Tonometry (Tonopen, 2:14 PM)      Right Left   Pressure 26 26       Tonometry #2 (Tonopen, 2:18 PM)      Right Left   Pressure 26 26       Pupils      Pupils Dark Light Shape React APD   Right PERRL 4 3 Round Brisk None   Left PERRL 5 4 Round Brisk None       Visual Fields (Counting fingers)      Left Right     Full   Restrictions Total superior temporal, superior nasal deficiencies  Extraocular Movement      Right Left    Full Full       Neuro/Psych    Oriented x3: Yes   Mood/Affect: Normal       Dilation    Both eyes: 1.0% Mydriacyl, 2.5% Phenylephrine @ 2:18 PM        Slit Lamp and Fundus Exam    Slit Lamp Exam      Right Left   Lids/Lashes Normal Normal   Conjunctiva/Sclera White and quiet White and quiet   Cornea Clear Clear   Anterior Chamber Deep and quiet Deep and quiet   Iris Round and reactive Round and reactive   Lens Posterior chamber intraocular lens Posterior chamber intraocular lens   Anterior Vitreous Normal Normal       Fundus Exam      Right Left   Posterior Vitreous Normal Normal   Disc Normal Normal   C/D Ratio 0.35 0.35   Macula Normal Normal   Vessels Normal Normal   Periphery No holes or tears Good scleral buckle, retina attached no new breaks          IMAGING AND PROCEDURES  Imaging and Procedures for 06/23/20  Color Fundus Photography Optos - OU - Both Eyes       Right Eye Progression has been stable. Disc findings include increased cup to disc ratio. Vessels : normal  observations. Periphery : normal observations.   Left Eye Progression has been stable. Disc findings include normal observations, increased cup to disc ratio. Macula : normal observations.   Notes Good scleral buckle left eye, no new retinal breaks                ASSESSMENT/PLAN:  Posterior vitreous detachment of right eye   The nature of posterior vitreous detachment was discussed with the patient as well as its physiology, its age prevalence, and its possible implication regarding retinal breaks and detachment.  An informational brochure was given to the patient.  All the patient's questions were answered.  The patient was asked to return if new or different flashes or floaters develops.   Patient was instructed to contact office immediately if any changes were noticed. I explained to the patient that vitreous inside the eye is similar to jello inside a bowl. As the jello melts it can start to pull away from the bowl, similarly the vitreous throughout our lives can begin to pull away from the retina. That process is called a posterior vitreous detachment. In some cases, the vitreous can tug hard enough on the retina to form a retinal tear. I discussed with the patient the signs and symptoms of a retinal detachment.  Do not rub the eye.  Primary open angle glaucoma of both eyes, moderate stage OAG likely due to findings of optic nerve and enlarged cup-to-disc, management as per Dr. Emily Filbert      ICD-10-CM   1. Posterior vitreous detachment of right eye  H43.811 Color Fundus Photography Optos - OU - Both Eyes  2. History of retinal detachment  Z86.69 Color Fundus Photography Optos - OU - Both Eyes  3. Pseudophakia  Z96.1 Color Fundus Photography Optos - OU - Both Eyes  4. Primary open angle glaucoma of both eyes, moderate stage  H40.1132 Color Fundus Photography Optos - OU - Both Eyes    1.  Patient to notify the office promptly if new onset visual acuity declines or  distortions  2.  3.  Ophthalmic Meds Ordered this visit:  No orders of the defined types were placed in this encounter.      Return in about 2 years (around 06/23/2022) for DILATE OU, COLOR FP, OCT.  There are no Patient Instructions on file for this visit.   Explained the diagnoses, plan, and follow up with the patient and they expressed understanding.  Patient expressed understanding of the importance of proper follow up care.   Alford Highland Maleena Eddleman M.D. Diseases & Surgery of the Retina and Vitreous [facility] 06/23/20     Abbreviations: M myopia (nearsighted); A astigmatism; H hyperopia (farsighted); P presbyopia; Mrx spectacle prescription;  CTL contact lenses; OD right eye; OS left eye; OU both eyes  XT exotropia; ET esotropia; PEK punctate epithelial keratitis; PEE punctate epithelial erosions; DES dry eye syndrome; MGD meibomian gland dysfunction; ATs artificial tears; PFAT's preservative free artificial tears; NSC nuclear sclerotic cataract; PSC posterior subcapsular cataract; ERM epi-retinal membrane; PVD posterior vitreous detachment; RD retinal detachment; DM diabetes mellitus; DR diabetic retinopathy; NPDR non-proliferative diabetic retinopathy; PDR proliferative diabetic retinopathy; CSME clinically significant macular edema; DME diabetic macular edema; dbh dot blot hemorrhages; CWS cotton wool spot; POAG primary open angle glaucoma; C/D cup-to-disc ratio; HVF humphrey visual field; GVF goldmann visual field; OCT optical coherence tomography; IOP intraocular pressure; BRVO Branch retinal vein occlusion; CRVO central retinal vein occlusion; CRAO central retinal artery occlusion; BRAO branch retinal artery occlusion; RT retinal tear; SB scleral buckle; PPV pars plana vitrectomy; VH Vitreous hemorrhage; PRP panretinal laser photocoagulation; IVK intravitreal kenalog; VMT vitreomacular traction; MH Macular hole;  NVD neovascularization of the disc; NVE  neovascularization elsewhere; AREDS age related eye disease study; ARMD age related macular degeneration; POAG primary open angle glaucoma; EBMD epithelial/anterior basement membrane dystrophy; ACIOL anterior chamber intraocular lens; IOL intraocular lens; PCIOL posterior chamber intraocular lens; Phaco/IOL phacoemulsification with intraocular lens placement; PRK photorefractive keratectomy; LASIK laser assisted in situ keratomileusis; HTN hypertension; DM diabetes mellitus; COPD chronic obstructive pulmonary disease

## 2020-06-23 NOTE — Assessment & Plan Note (Signed)
OAG likely due to findings of optic nerve and enlarged cup-to-disc, management as per Dr. Emily Filbert

## 2020-06-30 DIAGNOSIS — Z961 Presence of intraocular lens: Secondary | ICD-10-CM | POA: Diagnosis not present

## 2020-06-30 DIAGNOSIS — H401133 Primary open-angle glaucoma, bilateral, severe stage: Secondary | ICD-10-CM | POA: Diagnosis not present

## 2020-11-03 DIAGNOSIS — Z961 Presence of intraocular lens: Secondary | ICD-10-CM | POA: Diagnosis not present

## 2020-11-03 DIAGNOSIS — H401133 Primary open-angle glaucoma, bilateral, severe stage: Secondary | ICD-10-CM | POA: Diagnosis not present

## 2020-11-03 DIAGNOSIS — H26493 Other secondary cataract, bilateral: Secondary | ICD-10-CM | POA: Diagnosis not present

## 2020-11-03 DIAGNOSIS — H5201 Hypermetropia, right eye: Secondary | ICD-10-CM | POA: Diagnosis not present

## 2020-12-14 DIAGNOSIS — E7800 Pure hypercholesterolemia, unspecified: Secondary | ICD-10-CM | POA: Diagnosis not present

## 2020-12-14 DIAGNOSIS — Z0000 Encounter for general adult medical examination without abnormal findings: Secondary | ICD-10-CM | POA: Diagnosis not present

## 2020-12-14 DIAGNOSIS — Z1389 Encounter for screening for other disorder: Secondary | ICD-10-CM | POA: Diagnosis not present

## 2020-12-14 DIAGNOSIS — Z1159 Encounter for screening for other viral diseases: Secondary | ICD-10-CM | POA: Diagnosis not present

## 2021-03-04 DIAGNOSIS — H5213 Myopia, bilateral: Secondary | ICD-10-CM | POA: Diagnosis not present

## 2021-03-04 DIAGNOSIS — H524 Presbyopia: Secondary | ICD-10-CM | POA: Diagnosis not present

## 2021-03-04 DIAGNOSIS — H401133 Primary open-angle glaucoma, bilateral, severe stage: Secondary | ICD-10-CM | POA: Diagnosis not present

## 2021-05-08 ENCOUNTER — Encounter (HOSPITAL_COMMUNITY): Payer: Self-pay | Admitting: Emergency Medicine

## 2021-05-08 ENCOUNTER — Emergency Department (HOSPITAL_COMMUNITY)
Admission: EM | Admit: 2021-05-08 | Discharge: 2021-05-08 | Disposition: A | Discharge status: Home / Self Care | Payer: Medicare PPO | Attending: Emergency Medicine | Admitting: Emergency Medicine

## 2021-05-08 ENCOUNTER — Emergency Department (HOSPITAL_COMMUNITY): Payer: Medicare PPO

## 2021-05-08 ENCOUNTER — Other Ambulatory Visit: Payer: Self-pay

## 2021-05-08 DIAGNOSIS — M5030 Other cervical disc degeneration, unspecified cervical region: Secondary | ICD-10-CM | POA: Diagnosis not present

## 2021-05-08 DIAGNOSIS — R531 Weakness: Secondary | ICD-10-CM | POA: Diagnosis not present

## 2021-05-08 DIAGNOSIS — M189 Osteoarthritis of first carpometacarpal joint, unspecified: Secondary | ICD-10-CM | POA: Diagnosis not present

## 2021-05-08 DIAGNOSIS — S0990XA Unspecified injury of head, initial encounter: Secondary | ICD-10-CM | POA: Insufficient documentation

## 2021-05-08 DIAGNOSIS — S0081XA Abrasion of other part of head, initial encounter: Secondary | ICD-10-CM | POA: Insufficient documentation

## 2021-05-08 DIAGNOSIS — T07XXXA Unspecified multiple injuries, initial encounter: Secondary | ICD-10-CM

## 2021-05-08 DIAGNOSIS — J323 Chronic sphenoidal sinusitis: Secondary | ICD-10-CM | POA: Diagnosis not present

## 2021-05-08 DIAGNOSIS — S62357A Nondisplaced fracture of shaft of fifth metacarpal bone, left hand, initial encounter for closed fracture: Secondary | ICD-10-CM | POA: Insufficient documentation

## 2021-05-08 DIAGNOSIS — W19XXXA Unspecified fall, initial encounter: Secondary | ICD-10-CM | POA: Diagnosis not present

## 2021-05-08 DIAGNOSIS — R001 Bradycardia, unspecified: Secondary | ICD-10-CM | POA: Diagnosis not present

## 2021-05-08 DIAGNOSIS — W01198A Fall on same level from slipping, tripping and stumbling with subsequent striking against other object, initial encounter: Secondary | ICD-10-CM | POA: Diagnosis not present

## 2021-05-08 DIAGNOSIS — Z9889 Other specified postprocedural states: Secondary | ICD-10-CM | POA: Diagnosis not present

## 2021-05-08 DIAGNOSIS — S0031XA Abrasion of nose, initial encounter: Secondary | ICD-10-CM | POA: Diagnosis not present

## 2021-05-08 DIAGNOSIS — S62347A Nondisplaced fracture of base of fifth metacarpal bone. left hand, initial encounter for closed fracture: Secondary | ICD-10-CM | POA: Diagnosis not present

## 2021-05-08 DIAGNOSIS — Z23 Encounter for immunization: Secondary | ICD-10-CM | POA: Insufficient documentation

## 2021-05-08 DIAGNOSIS — R0689 Other abnormalities of breathing: Secondary | ICD-10-CM | POA: Diagnosis not present

## 2021-05-08 DIAGNOSIS — Y93K1 Activity, walking an animal: Secondary | ICD-10-CM | POA: Insufficient documentation

## 2021-05-08 DIAGNOSIS — M25539 Pain in unspecified wrist: Secondary | ICD-10-CM | POA: Diagnosis not present

## 2021-05-08 DIAGNOSIS — M79642 Pain in left hand: Secondary | ICD-10-CM | POA: Diagnosis not present

## 2021-05-08 DIAGNOSIS — S59912A Unspecified injury of left forearm, initial encounter: Secondary | ICD-10-CM | POA: Diagnosis not present

## 2021-05-08 DIAGNOSIS — S199XXA Unspecified injury of neck, initial encounter: Secondary | ICD-10-CM | POA: Diagnosis not present

## 2021-05-08 DIAGNOSIS — S60512A Abrasion of left hand, initial encounter: Secondary | ICD-10-CM | POA: Diagnosis not present

## 2021-05-08 DIAGNOSIS — S6992XA Unspecified injury of left wrist, hand and finger(s), initial encounter: Secondary | ICD-10-CM | POA: Diagnosis present

## 2021-05-08 MED ORDER — TETANUS-DIPHTH-ACELL PERTUSSIS 5-2.5-18.5 LF-MCG/0.5 IM SUSY
0.5000 mL | PREFILLED_SYRINGE | Freq: Once | INTRAMUSCULAR | Status: AC
  Administered 2021-05-08: 0.5 mL via INTRAMUSCULAR
  Filled 2021-05-08: qty 0.5

## 2021-05-08 NOTE — ED Triage Notes (Signed)
Pt to triage via [facility].  Pt was walking her puppy and tripped on concrete.  C/o pain to L forearm and L wrist.  Abrasions to nose, L cheek, and L forehead.  Reports possible LOC.  Denies neck and back pain.  C-collar in place by EMS.  CBG 102.  18g R hand.

## 2021-05-08 NOTE — ED Notes (Signed)
Patient transported to CT 

## 2021-05-08 NOTE — Progress Notes (Signed)
Orthopedic Tech Progress Note Patient Details:  ESHAAL DUBY [birth year] [id number] Patient requested an arm sling for splint support  Ortho Devices Type of Ortho Device: Arm sling, Ulna gutter splint Ortho Device/Splint Location: Left hand/arm Ortho Device/Splint Interventions: Application   Post Interventions Patient Tolerated: Well  Delsy Etzkorn E Ellamae Lybeck 05/08/2021, 10:01 AM

## 2021-05-08 NOTE — ED Provider Notes (Signed)
[facility] EMERGENCY DEPARTMENT Provider Note   CSN: [id number] Arrival date & time: 05/08/21  0732     History Chief Complaint  Patient presents with   Kendra Flynn    Kendra Flynn is a 70 y.o. female.  70 year old female presents via EMS for evaluation after fall.  Patient states that she was walking her puppy when she tripped on the concrete landing on her left hand and hit her face on the concrete.  Unsure if she lost consciousness, does not believe she did as she was worried about her puppy being alongside the road.  She is not anticoagulated.  Patient is right-hand dominant.  No other injuries, complaints, concerns.      History reviewed. No pertinent past medical history.  Patient Active Problem List   Diagnosis Date Noted   Posterior vitreous detachment of right eye 06/23/2020   History of retinal detachment 06/23/2020   Pseudophakia 06/23/2020   Primary open angle glaucoma of both eyes, moderate stage 06/23/2020   ANXIETY 07/17/2007   SUPRAVENTRICULAR TACHYCARDIA 07/17/2007   ALLERGIC RHINITIS 07/17/2007   FIBROMYALGIA 07/17/2007   INSOMNIA 07/17/2007   FATIGUE, CHRONIC 07/17/2007   HIATAL HERNIA, HX OF 07/17/2007    Past Surgical History:  Procedure Laterality Date   CATARACT EXTRACTION     left eye 01/16/12   RETINAL DETACHMENT SURGERY     left eye 05/27/11   TONSILLECTOMY     1965     OB History   No obstetric history on file.     Family History  Problem Relation Age of Onset   Colon cancer Neg Hx    Pancreatic cancer Neg Hx    Rectal cancer Neg Hx    Stomach cancer Neg Hx     Social History   Tobacco Use   Smoking status: Never   Smokeless tobacco: Never  Substance Use Topics   Alcohol use: No   Drug use: No    Home Medications Prior to Admission medications   Medication Sig Start Date End Date Taking? Authorizing Provider  buPROPion (WELLBUTRIN SR) 150 MG 12 hr tablet Take 150 mg by mouth daily.    [provider]  dorzolamide-timolol (COSOPT) 22.3-6.8 MG/ML ophthalmic solution  05/26/20   [provider]  DULoxetine (CYMBALTA) 60 MG capsule Take 60 mg by mouth daily.    [provider]  latanoprost (XALATAN) 0.005 % ophthalmic solution  05/26/20   [provider]  oxyCODONE-acetaminophen (PERCOCET/ROXICET) 5-325 MG per tablet Take 1-2 tablets by mouth every 4 (four) hours as needed for severe pain. 10/23/13   Raeford Razor, MD    Allergies    Amoxicillin  Review of Systems   Review of Systems  Constitutional:  Negative for fever.  Eyes:  Negative for visual disturbance.  Respiratory:  Negative for shortness of breath.   Cardiovascular:  Negative for chest pain.  Gastrointestinal:  Negative for abdominal pain, nausea and vomiting.  Musculoskeletal:  Positive for arthralgias, joint swelling and myalgias. Negative for back pain, neck pain and neck stiffness.  Skin:  Positive for wound.  Allergic/Immunologic: Negative for immunocompromised state.  Neurological:  Negative for weakness, numbness and headaches.  Hematological:  Does not bruise/bleed easily.  Psychiatric/Behavioral:  Negative for confusion.   All other systems reviewed and are negative.  Physical Exam Updated Vital Signs BP 122/65   Pulse (!) 55   Temp 97.8 F (36.6 C) (Oral)   Resp 20   SpO2 99%   Physical Exam  Vitals and nursing note reviewed.  Constitutional:      General: She is not in acute distress.    Appearance: She is well-developed. She is not diaphoretic.  HENT:     Head: Normocephalic.     Comments: Abrasions to nose, left cheek, forehead    Mouth/Throat:     Mouth: Mucous membranes are moist.  Eyes:     Extraocular Movements: Extraocular movements intact.     Pupils: Pupils are equal, round, and reactive to light.  Neck:     Comments: C-collar in place.  No midline tenderness or step-offs. Cardiovascular:     Rate and Rhythm: Normal rate and regular rhythm.      Heart sounds: Normal heart sounds.  Pulmonary:     Effort: Pulmonary effort is normal.     Breath sounds: Normal breath sounds.  Abdominal:     Palpations: Abdomen is soft.     Tenderness: There is no abdominal tenderness.  Musculoskeletal:        General: Swelling and tenderness present. No deformity.     Left elbow: Normal. Normal range of motion. No tenderness.     Cervical back: No tenderness or bony tenderness.     Thoracic back: No tenderness or bony tenderness.     Lumbar back: No tenderness or bony tenderness.     Comments: Mild tenderness along left forearm.  Mild tenderness to left hand with minor abrasion to the hypothenar eminence and left fifth finger.  Skin:    General: Skin is warm and dry.     Findings: Bruising present. No rash.  Neurological:     Mental Status: She is alert and oriented to person, place, and time.     Sensory: No sensory deficit.     Motor: No weakness.  Psychiatric:        Behavior: Behavior normal.    ED Results / Procedures / Treatments   Labs (all labs ordered are listed, but only abnormal results are displayed) Labs Reviewed - No data to display  EKG None  Radiology DG Forearm Left  Result Date: 05/08/2021 CLINICAL DATA:  70 year old female status post trip and fall on concrete. Pain. EXAM: LEFT FOREARM - 2 VIEW COMPARISON:  Left wrist series today. FINDINGS: Bone mineralization is within normal limits. Alignment appears preserved at the left elbow and wrist. There is no evidence of fracture or other focal bone lesions. No discrete soft tissue injury. IMPRESSION: No acute fracture or dislocation identified about the left forearm. Electronically Signed   By: Odessa Fleming M.D.   On: 05/08/2021 08:37   DG Wrist Complete Left  Result Date: 05/08/2021 CLINICAL DATA:  70 year old female status post trip and fall on concrete. Pain. EXAM: LEFT WRIST - COMPLETE 3+ VIEW COMPARISON:  None. FINDINGS: Bone mineralization is within normal limits for age.  Nondisplaced oblique fracture at the base of the 5th metacarpal, possibly intra-articular (arrows). No other evidence of fracture or dislocation. Normal joint spaces and alignment for age. Mild to moderate osteoarthritis at the 1st [facility] joint. IMPRESSION: 1. Nondisplaced oblique fracture at the base of the 5th metacarpal, possibly intra-articular. 2. No other acute fracture or dislocation identified about the left wrist. Electronically Signed   By: Odessa Fleming M.D.   On: 05/08/2021 08:36   CT Head Wo Contrast  Result Date: 05/08/2021 CLINICAL DATA:  70 year old female tripped and fell on cement. EXAM: CT HEAD WITHOUT CONTRAST TECHNIQUE: Contiguous axial images were obtained from the base of the skull through  the vertex without intravenous contrast. COMPARISON:  Brain MRI 02/17/2015.  Head CT 03/28/2007. FINDINGS: Brain: Cerebral volume is not significantly changed since two thousand sixteen. No midline shift, ventriculomegaly, mass effect, evidence of mass lesion, intracranial hemorrhage or evidence of cortically based acute infarction. Gray-white matter differentiation is within normal limits throughout the brain. Vascular: No suspicious intracranial vascular hyperdensity. Skull: Stable and intact. Sinuses/Orbits: Chronic fluid and bubbly opacity in the left sphenoid sinus is stable since 2008. Other Visualized paranasal sinuses and mastoids are stable and well aerated. Other: Interval postoperative changes to both globes. No acute orbit or scalp soft tissue injury identified. IMPRESSION: 1. No acute traumatic injury identified. 2. Negative for age non contrast CT appearance of the brain. 3. Mild chronic left sphenoid sinus inflammation. Electronically Signed   By: Odessa Fleming M.D.   On: 05/08/2021 09:54   CT Cervical Spine Wo Contrast  Result Date: 05/08/2021 CLINICAL DATA:  70 year old female tripped and fell on cement. EXAM: CT CERVICAL SPINE WITHOUT CONTRAST TECHNIQUE: Multidetector CT imaging of the cervical  spine was performed without intravenous contrast. Multiplanar CT image reconstructions were also generated. COMPARISON:  Head and face CT today. FINDINGS: Alignment: Relatively preserved cervical lordosis. Cervicothoracic junction alignment is within normal limits. Bilateral posterior element alignment is within normal limits. Skull base and vertebrae: Visualized skull base is intact. No atlanto-occipital dissociation. C1 and C2 are intact and aligned. No acute osseous abnormality identified. Soft tissues and spinal canal: No prevertebral fluid or swelling. No visible canal hematoma. Negative visible noncontrast neck soft tissues. Disc levels:  Mild for age cervical spine degeneration. Upper chest: Negative. IMPRESSION: 1. No acute traumatic injury identified in the cervical spine. 2. Mild for age cervical spine degeneration. Electronically Signed   By: Odessa Fleming M.D.   On: 05/08/2021 09:59   CT Maxillofacial WO CM  Result Date: 05/08/2021 CLINICAL DATA:  70 year old female tripped and fell on cement. Face and nose abrasions. EXAM: CT MAXILLOFACIAL WITHOUT CONTRAST TECHNIQUE: Multidetector CT imaging of the maxillofacial structures was performed. Multiplanar CT image reconstructions were also generated. COMPARISON:  Head CT today. FINDINGS: Osseous: Mandible normally located and appears intact. No maxilla, zygoma, or pterygoid fracture. Nasal bones appear intact. Intact central skull base. Orbits: No orbital wall fracture identified. Postoperative changes to both globes. No acute soft tissue finding. Sinuses: Mild chronic fluid and bubbly opacity in the left sphenoid sinus is stable since 2008. Other Visualized paranasal sinuses and mastoids are clear. Soft tissues: Negative visible noncontrast thyroid, larynx, pharynx, parapharyngeal spaces, retropharyngeal space, sublingual space, submandibular spaces, masticator and parotid spaces. No superficial soft tissue injury identified. Limited intracranial: Stable to  that reported separately IMPRESSION: No acute traumatic injury identified about the Face. Electronically Signed   By: Odessa Fleming M.D.   On: 05/08/2021 10:25    Procedures Procedures   Medications Ordered in ED Medications  Tdap (BOOSTRIX) injection 0.5 mL (0.5 mLs Intramuscular Given 05/08/21 0957)    ED Course  I have reviewed the triage vital signs and the nursing notes.  Pertinent labs & imaging results that were available during my care of the patient were reviewed by me and considered in my medical decision making (see chart for details).  Clinical Course as of 05/08/21 1101  Sat May 08, 2021  [id number] 70 year old female presents after a fall today as above.  Patient is found to have 1/5 metacarpal fracture.  She is mildly tender in this area.  She is placed in a ulnar gutter splint  and referred to orthopedics.  Patient prefers to follow-up with [facility] and will call them on Monday.  Her remaining work-up including a CT of her head, face, neck for reasons of fall and injury to her face are negative for acute injury.  Tetanus is updated.  Return precautions given. [LM]    Clinical Course User Index [LM] Alden HippMurphy, Brealyn Baril A, PA-C   MDM Rules/Calculators/A&P                           SPLINT APPLICATION Date/Time: 11:01 AM Authorized by: Jeannie FendLaura A Alaric Gladwin Consent: Verbal consent obtained. Risks and benefits: risks, benefits and alternatives were discussed Consent given by: patient Splint applied by: orthopedic technician Location details: left arm Splint type: ulnar gutter Supplies used: fiberglass OCL, webril, ace  Post-procedure: The splinted body part was neurovascularly unchanged following the procedure. Patient tolerance: Patient tolerated the procedure well with no immediate complications.   Final Clinical Impression(s) / ED Diagnoses Final diagnoses:  Fall, initial encounter  Closed nondisplaced fracture of base of fifth metacarpal bone of left hand, initial encounter   Multiple abrasions    Rx / DC Orders ED Discharge Orders     None        Jeannie FendMurphy, Lealon Vanputten A, PA-C 05/08/21 1101    Alvira MondaySchlossman, Erin, MD 05/08/21 2236

## 2021-05-08 NOTE — Discharge Instructions (Addendum)
Follow-up with orthopedics, call on Monday to schedule an appointment.  In the meantime, keep your splint clean and dry.  You may apply ice over top of the splint for 20 minutes at a time and elevate to help with pain and swelling.  Recommend Tylenol as needed as directed for pain. Keep wounds clean, you may apply bacitracin if needed.

## 2021-05-12 DIAGNOSIS — S62317A Displaced fracture of base of fifth metacarpal bone. left hand, initial encounter for closed fracture: Secondary | ICD-10-CM | POA: Diagnosis not present

## 2021-06-02 DIAGNOSIS — S62316D Displaced fracture of base of fifth metacarpal bone, right hand, subsequent encounter for fracture with routine healing: Secondary | ICD-10-CM | POA: Diagnosis not present

## 2021-07-13 DIAGNOSIS — H401133 Primary open-angle glaucoma, bilateral, severe stage: Secondary | ICD-10-CM | POA: Diagnosis not present

## 2021-07-13 DIAGNOSIS — Z961 Presence of intraocular lens: Secondary | ICD-10-CM | POA: Diagnosis not present

## 2021-09-17 IMAGING — CT CT MAXILLOFACIAL W/O CM
3 of 6 series · 16 of 47 positions shown, 19 images · non-contrast
Comparison: Head CT today.

CLINICAL DATA: 70-year-old female tripped and fell on cement. Face
and nose abrasions.

EXAM:
CT MAXILLOFACIAL WITHOUT CONTRAST
TECHNIQUE: Multidetector CT imaging of the maxillofacial structures was
performed. Multiplanar CT image reconstructions were also generated.

[Series 3: maxilllofacial 2.0 hr40 3 · axial · 0.39mm/px · z∈[-229,-85]mm · 11 of 84 slices shown, 14 images]
[im 6/84  brain]
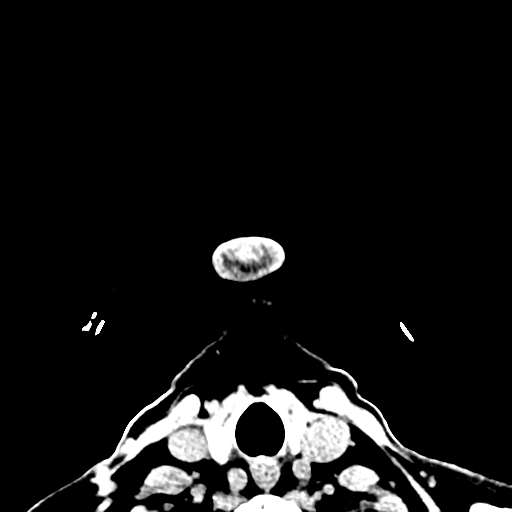
[im 6/84  bone]
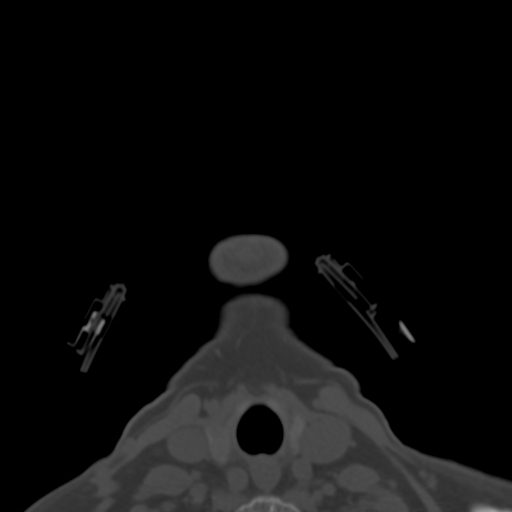
[im 12/84  bone]
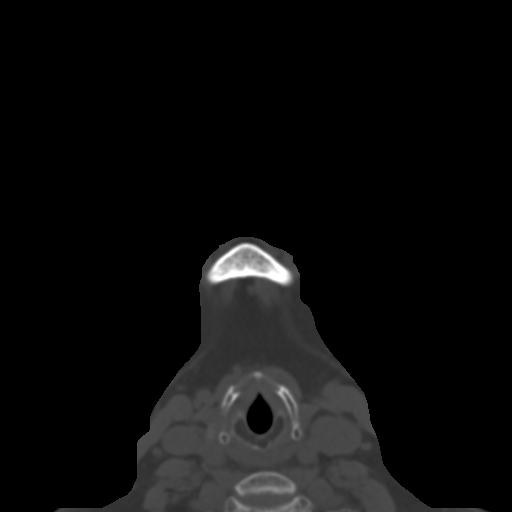
[im 18/84  bone]
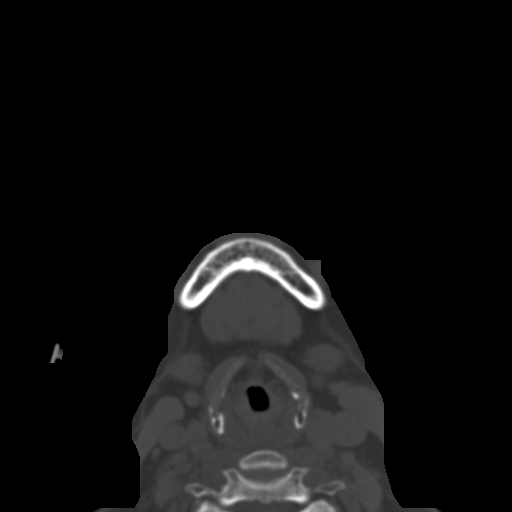
[im 30/84  bone]
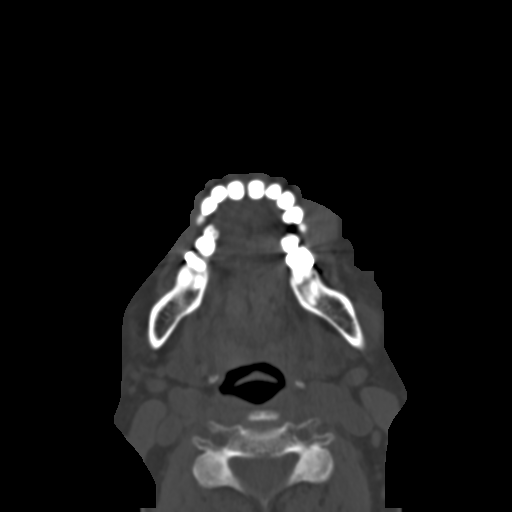
[im 36/84  brain]
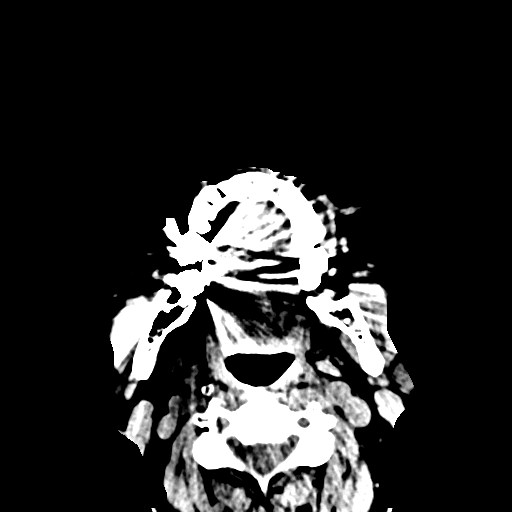
[im 36/84  bone]
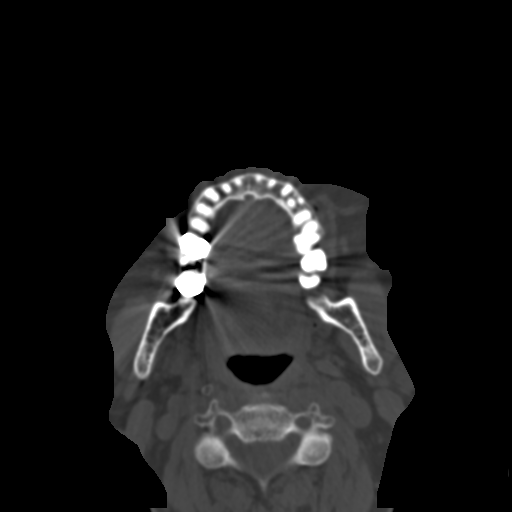
[im 42/84  bone]
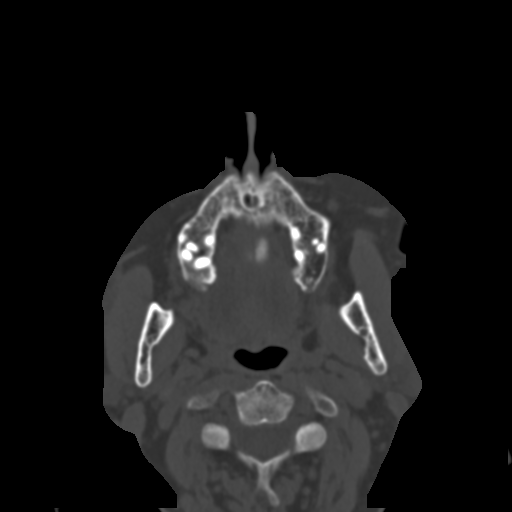
[im 48/84  bone]
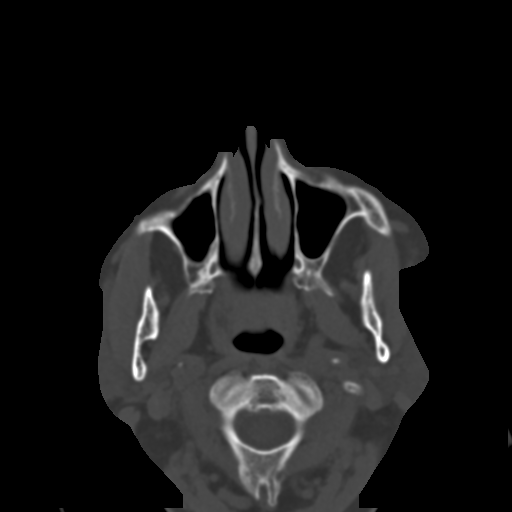
[im 54/84  bone]
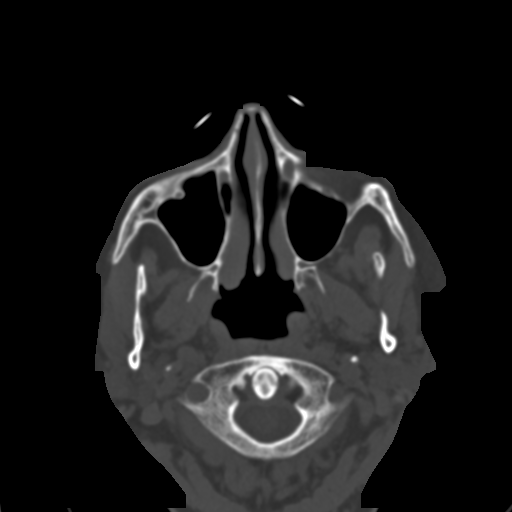
[im 66/84  brain]
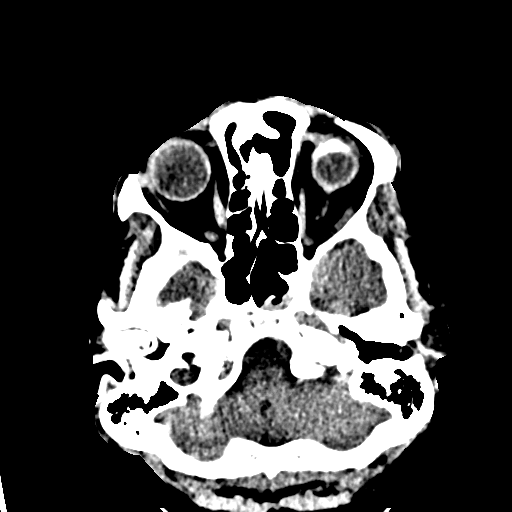
[im 66/84  bone]
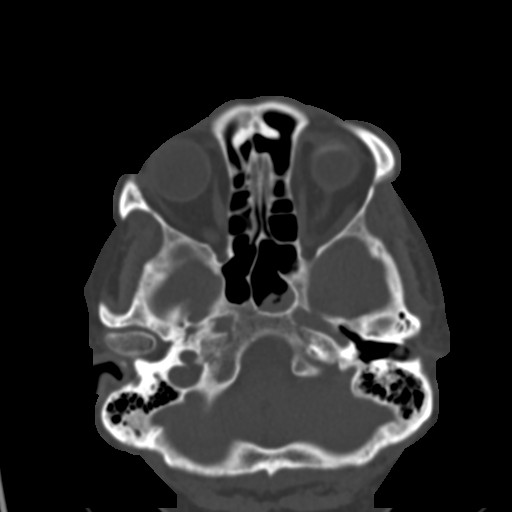
[im 72/84  bone]
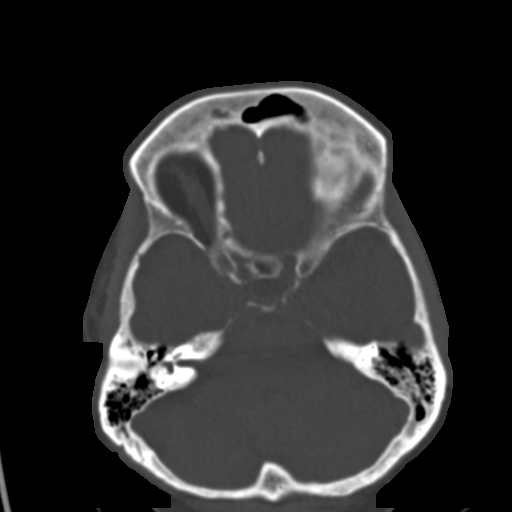
[im 78/84  bone]
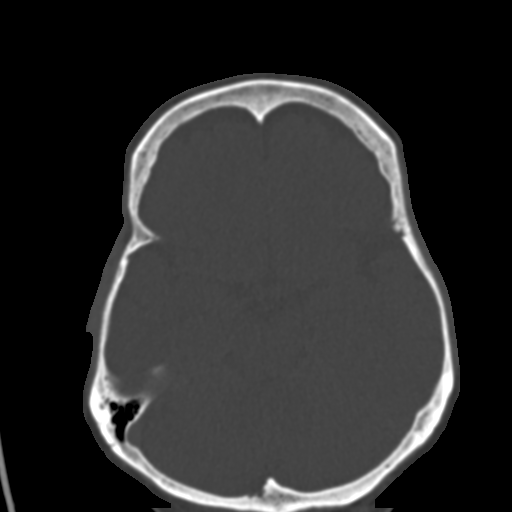

[Series 13: bone cor facial · coronal · 0.33mm/px · 3 of 75 slices shown]
[im 19/75  bone]
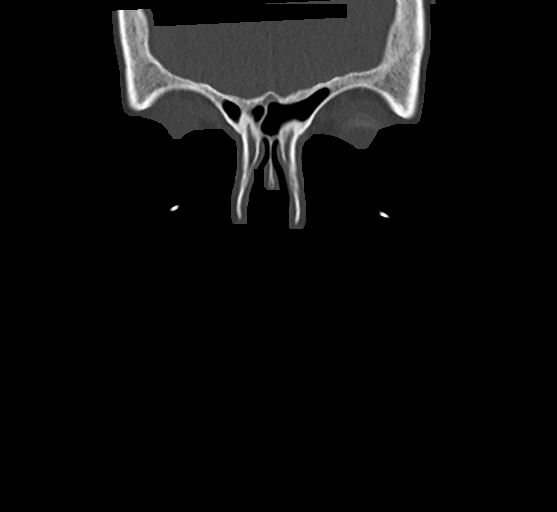
[im 38/75  bone]
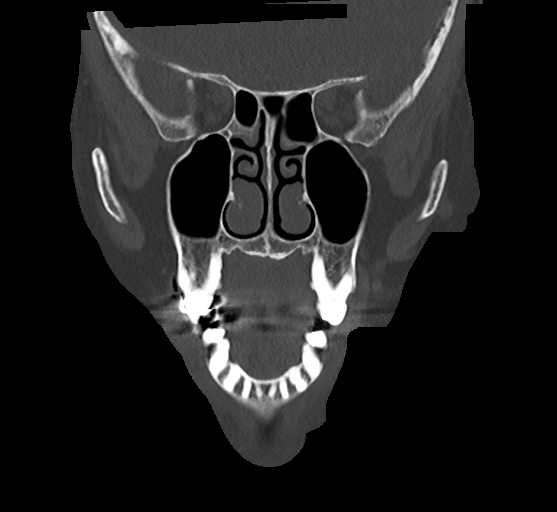
[im 56/75  bone]
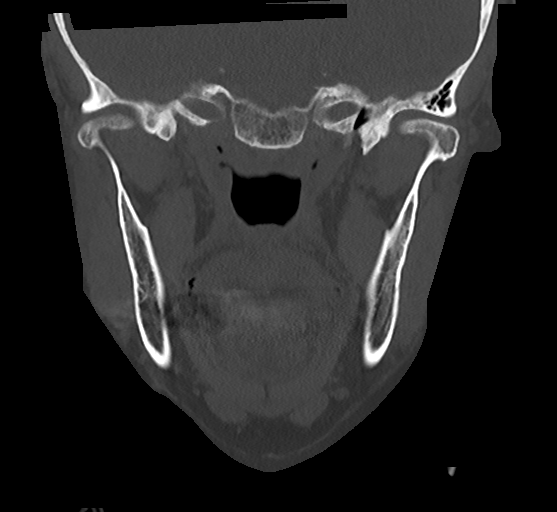

[Series 15: bone sag facial · sagittal · 0.33mm/px · 2 of 90 slices shown]
[im 30/90  bone]
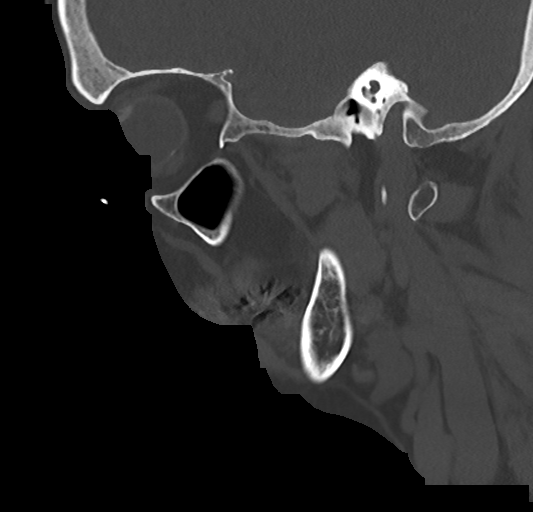
[im 60/90  bone]
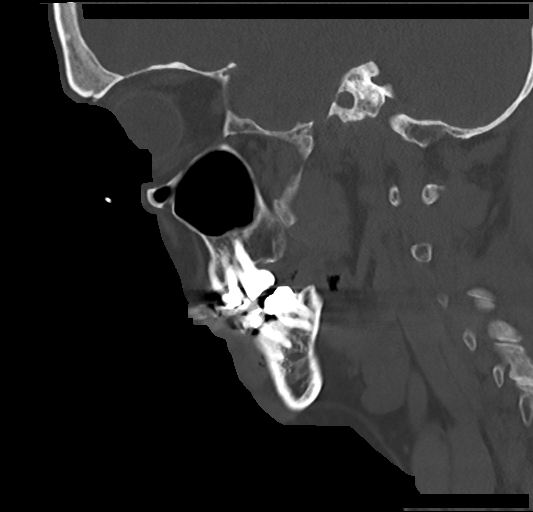

[16 of 47 positions shown; findings below may reference images not displayed]

FINDINGS: Osseous: Mandible normally located and appears intact. No maxilla,
zygoma, or pterygoid fracture. Nasal bones appear intact. Intact
central skull base.

Orbits: No orbital wall fracture identified. Postoperative changes
to both globes. No acute soft tissue finding.

Sinuses: Mild chronic fluid and bubbly opacity in the left sphenoid
sinus is stable since 4005. Other Visualized paranasal sinuses and
mastoids are clear.

Soft tissues: Negative visible noncontrast thyroid, larynx, pharynx,
parapharyngeal spaces, retropharyngeal space, sublingual space,
submandibular spaces, masticator and parotid spaces. No superficial
soft tissue injury identified.

Limited intracranial: Stable to that reported separately
IMPRESSION: No acute traumatic injury identified about the Face.

## 2021-11-16 DIAGNOSIS — H5213 Myopia, bilateral: Secondary | ICD-10-CM | POA: Diagnosis not present

## 2021-11-16 DIAGNOSIS — H401133 Primary open-angle glaucoma, bilateral, severe stage: Secondary | ICD-10-CM | POA: Diagnosis not present

## 2021-12-17 DIAGNOSIS — H0100A Unspecified blepharitis right eye, upper and lower eyelids: Secondary | ICD-10-CM | POA: Diagnosis not present

## 2021-12-17 DIAGNOSIS — H401133 Primary open-angle glaucoma, bilateral, severe stage: Secondary | ICD-10-CM | POA: Diagnosis not present

## 2021-12-17 DIAGNOSIS — H10412 Chronic giant papillary conjunctivitis, left eye: Secondary | ICD-10-CM | POA: Diagnosis not present

## 2021-12-17 DIAGNOSIS — H0015 Chalazion left lower eyelid: Secondary | ICD-10-CM | POA: Diagnosis not present

## 2021-12-29 DIAGNOSIS — Z1389 Encounter for screening for other disorder: Secondary | ICD-10-CM | POA: Diagnosis not present

## 2021-12-29 DIAGNOSIS — E7800 Pure hypercholesterolemia, unspecified: Secondary | ICD-10-CM | POA: Diagnosis not present

## 2021-12-29 DIAGNOSIS — Z0000 Encounter for general adult medical examination without abnormal findings: Secondary | ICD-10-CM | POA: Diagnosis not present

## 2022-03-17 DIAGNOSIS — H5340 Unspecified visual field defects: Secondary | ICD-10-CM | POA: Diagnosis not present

## 2022-03-17 DIAGNOSIS — H02831 Dermatochalasis of right upper eyelid: Secondary | ICD-10-CM | POA: Diagnosis not present

## 2022-03-17 DIAGNOSIS — H02834 Dermatochalasis of left upper eyelid: Secondary | ICD-10-CM | POA: Diagnosis not present

## 2022-03-17 DIAGNOSIS — H401232 Low-tension glaucoma, bilateral, moderate stage: Secondary | ICD-10-CM | POA: Diagnosis not present

## 2022-06-14 DIAGNOSIS — L821 Other seborrheic keratosis: Secondary | ICD-10-CM | POA: Diagnosis not present

## 2022-06-16 DIAGNOSIS — H8110 Benign paroxysmal vertigo, unspecified ear: Secondary | ICD-10-CM | POA: Diagnosis not present

## 2022-06-16 DIAGNOSIS — H6123 Impacted cerumen, bilateral: Secondary | ICD-10-CM | POA: Diagnosis not present

## 2022-06-23 ENCOUNTER — Encounter (INDEPENDENT_AMBULATORY_CARE_PROVIDER_SITE_OTHER): Payer: Self-pay | Admitting: Ophthalmology

## 2022-06-23 ENCOUNTER — Ambulatory Visit (INDEPENDENT_AMBULATORY_CARE_PROVIDER_SITE_OTHER): Payer: Medicare PPO | Admitting: Ophthalmology

## 2022-06-23 DIAGNOSIS — H43811 Vitreous degeneration, right eye: Secondary | ICD-10-CM | POA: Diagnosis not present

## 2022-06-23 DIAGNOSIS — H401132 Primary open-angle glaucoma, bilateral, moderate stage: Secondary | ICD-10-CM | POA: Diagnosis not present

## 2022-06-23 DIAGNOSIS — Z8669 Personal history of other diseases of the nervous system and sense organs: Secondary | ICD-10-CM

## 2022-06-23 NOTE — Assessment & Plan Note (Signed)
No new holes or tears OS

## 2022-06-23 NOTE — Assessment & Plan Note (Addendum)
Continue topical medications under the direction of Dr. Melissa Noon  Optic nerve OU with thinning of the rim inferiorly with cup to the rim nearly inferiorly as well

## 2022-06-23 NOTE — Assessment & Plan Note (Signed)
OD.  No holes or tears

## 2022-06-23 NOTE — Progress Notes (Signed)
06/23/2022     CHIEF COMPLAINT Patient presents for  Chief Complaint  Patient presents with   Posterior Vitreous Detachment      HISTORY OF PRESENT ILLNESS: Kendra Flynn is a 71 y.o. female who presents to the clinic today for:   HPI   2 YR FU OU OCT FP. Pt stated no changes in vision. However, pt stated vision has improved since cataract sx.  Pt denies new floaters and FOL. Pt is taking Latanoprost- 1 drop into both eyes at bedtime DORZ TOMOLOL- 1 drop into both eyes 2x daily.  Last edited by Angeline Slim on 06/23/2022 12:58 PM.      Referring physician: Manning Charity, OD [address] [place],  [place] [address]  HISTORICAL INFORMATION:   Selected notes from the MEDICAL RECORD NUMBER       CURRENT MEDICATIONS: Current Outpatient Medications (Ophthalmic Drugs)  Medication Sig   dorzolamide-timolol (COSOPT) 22.3-6.8 MG/ML ophthalmic solution    latanoprost (XALATAN) 0.005 % ophthalmic solution    No current facility-administered medications for this visit. (Ophthalmic Drugs)   Current Outpatient Medications (Other)  Medication Sig   buPROPion (WELLBUTRIN SR) 150 MG 12 hr tablet Take 150 mg by mouth daily.   DULoxetine (CYMBALTA) 60 MG capsule Take 60 mg by mouth daily.   oxyCODONE-acetaminophen (PERCOCET/ROXICET) 5-325 MG per tablet Take 1-2 tablets by mouth every 4 (four) hours as needed for severe pain.   No current facility-administered medications for this visit. (Other)      REVIEW OF SYSTEMS: ROS   Negative for: Constitutional, Gastrointestinal, Neurological, Skin, Genitourinary, Musculoskeletal, HENT, Endocrine, Cardiovascular, Eyes, Respiratory, Psychiatric, Allergic/Imm, Heme/Lymph Last edited by Angeline Slim on 06/23/2022 12:58 PM.       ALLERGIES Allergies  Allergen Reactions   Amoxicillin     REACTION: vomiting    PAST MEDICAL HISTORY History reviewed. No pertinent past medical history. Past Surgical History:  Procedure Laterality Date    CATARACT EXTRACTION     left eye 01/16/12   RETINAL DETACHMENT SURGERY     left eye 05/27/11   TONSILLECTOMY     1965    FAMILY HISTORY Family History  Problem Relation Age of Onset   Colon cancer Neg Hx    Pancreatic cancer Neg Hx    Rectal cancer Neg Hx    Stomach cancer Neg Hx     SOCIAL HISTORY Social History   Tobacco Use   Smoking status: Never   Smokeless tobacco: Never  Substance Use Topics   Alcohol use: No   Drug use: No         OPHTHALMIC EXAM:  Base Eye Exam     Visual Acuity (ETDRS)       Right Left   Dist [place] 20/20 20/20  Pt forgot her glasses in her car.         Tonometry (Tonopen, 1:04 PM)       Right Left   Pressure 9 13         Pupils       Pupils APD   Right PERRL None   Left PERRL None         Visual Fields       Left Right   Restrictions Total superior temporal, superior nasal deficiencies          Extraocular Movement       Right Left    Full, Ortho Full, Ortho         Neuro/Psych  Oriented x3: Yes   Mood/Affect: Normal         Dilation     Both eyes: 1.0% Mydriacyl, 2.5% Phenylephrine @ 1:04 PM           Slit Lamp and Fundus Exam     External Exam       Right Left   External Normal Normal         Slit Lamp Exam       Right Left   Lids/Lashes Normal Normal   Conjunctiva/Sclera White and quiet White and quiet   Cornea Clear Clear   Anterior Chamber Deep and quiet Deep and quiet   Iris Round and reactive Round and reactive   Lens Posterior chamber intraocular lens Posterior chamber intraocular lens   Anterior Vitreous Normal Normal         Fundus Exam       Right Left   Posterior Vitreous Normal Normal   Disc Normal Normal   C/D Ratio 0.35 0.35   Macula Normal Normal   Vessels Normal Normal   Periphery No holes or tears Good scleral buckle, retina attached no new breaks            IMAGING AND PROCEDURES  Imaging and Procedures for 06/23/22  OCT, Retina - OU -  Both Eyes       Right Eye Quality was good. Scan locations included subfoveal. Central Foveal Thickness: 285. Findings include abnormal foveal contour.   Left Eye Quality was good. Scan locations included subfoveal. Central Foveal Thickness: 376. Findings include abnormal foveal contour.   Notes Diffuse macular atrophy inferior hemifield correlates and corresponds with the area of optic nerve cupping likely RNFL loss.  No active maculopathy     Color Fundus Photography Optos - OU - Both Eyes       Right Eye Progression has been stable. Disc findings include increased cup to disc ratio. Vessels : normal observations. Periphery : normal observations.   Left Eye Progression has been stable. Disc findings include normal observations, increased cup to disc ratio. Macula : normal observations.   Notes Good scleral buckle left eye, no new retinal breaks  Cupping of the optic nerves inferiorly.  Stable though             ASSESSMENT/PLAN:  History of retinal detachment No new holes or tears OS  Posterior vitreous detachment of right eye OD.  No holes or tears  Primary open angle glaucoma of both eyes, moderate stage Continue topical medications under the direction of Dr. Manning Charity  Optic nerve OU with thinning of the rim inferiorly with cup to the rim nearly inferiorly as well     ICD-10-CM   1. Posterior vitreous detachment of right eye  H43.811 OCT, Retina - OU - Both Eyes    Color Fundus Photography Optos - OU - Both Eyes    2. History of retinal detachment  Z86.69     3. Primary open angle glaucoma of both eyes, moderate stage  H40.1132       1.  History of retinal detachment left eye looks great  2.  No new breaks OS, and no new breaks OD  3.  Pseudophakia OU look great  Open-angle glaucoma under direction Dr. Manning Charity follow-up as scheduled with Dr. Emily Filbert  Ophthalmic Meds Ordered this visit:  No orders of the defined types were placed in this  encounter.      Return in about 1 year (around 06/24/2023) for DILATE OU,  COLOR FP, OCT.  There are no Patient Instructions on file for this visit.   Explained the diagnoses, plan, and follow up with the patient and they expressed understanding.  Patient expressed understanding of the importance of proper follow up care.   Clent Demark Marque Rademaker M.D. Diseases & Surgery of the Retina and Vitreous [facility] 06/23/22     Abbreviations: M myopia (nearsighted); A astigmatism; H hyperopia (farsighted); P presbyopia; Mrx spectacle prescription;  CTL contact lenses; OD right eye; OS left eye; OU both eyes  XT exotropia; ET esotropia; PEK punctate epithelial keratitis; PEE punctate epithelial erosions; DES dry eye syndrome; MGD meibomian gland dysfunction; ATs artificial tears; PFAT's preservative free artificial tears; [place] nuclear sclerotic cataract; PSC posterior subcapsular cataract; ERM epi-retinal membrane; PVD posterior vitreous detachment; RD retinal detachment; DM diabetes mellitus; DR diabetic retinopathy; NPDR non-proliferative diabetic retinopathy; PDR proliferative diabetic retinopathy; CSME clinically significant macular edema; DME diabetic macular edema; dbh dot blot hemorrhages; CWS cotton wool spot; POAG primary open angle glaucoma; C/D cup-to-disc ratio; HVF humphrey visual field; GVF goldmann visual field; OCT optical coherence tomography; IOP intraocular pressure; BRVO Branch retinal vein occlusion; CRVO central retinal vein occlusion; CRAO central retinal artery occlusion; BRAO branch retinal artery occlusion; RT retinal tear; SB scleral buckle; PPV pars plana vitrectomy; VH Vitreous hemorrhage; PRP panretinal laser photocoagulation; IVK intravitreal kenalog; VMT vitreomacular traction; MH Macular hole;  NVD neovascularization of the disc; NVE neovascularization elsewhere; AREDS age related eye disease study; ARMD age related macular degeneration; POAG primary open angle  glaucoma; EBMD epithelial/anterior basement membrane dystrophy; ACIOL anterior chamber intraocular lens; IOL intraocular lens; PCIOL posterior chamber intraocular lens; Phaco/IOL phacoemulsification with intraocular lens placement; [place] photorefractive keratectomy; LASIK laser assisted in situ keratomileusis; HTN hypertension; DM diabetes mellitus; COPD chronic obstructive pulmonary disease

## 2022-07-11 DIAGNOSIS — H401233 Low-tension glaucoma, bilateral, severe stage: Secondary | ICD-10-CM | POA: Diagnosis not present

## 2022-11-09 DIAGNOSIS — H01002 Unspecified blepharitis right lower eyelid: Secondary | ICD-10-CM | POA: Diagnosis not present

## 2022-11-09 DIAGNOSIS — H401232 Low-tension glaucoma, bilateral, moderate stage: Secondary | ICD-10-CM | POA: Diagnosis not present

## 2022-11-09 DIAGNOSIS — H5340 Unspecified visual field defects: Secondary | ICD-10-CM | POA: Diagnosis not present

## 2023-01-14 ENCOUNTER — Emergency Department (HOSPITAL_COMMUNITY): Payer: Medicare PPO

## 2023-01-14 ENCOUNTER — Other Ambulatory Visit: Payer: Self-pay

## 2023-01-14 ENCOUNTER — Emergency Department (HOSPITAL_COMMUNITY)
Admission: EM | Admit: 2023-01-14 | Discharge: 2023-01-14 | Disposition: A | Discharge status: Home / Self Care | Payer: Medicare PPO | Attending: Emergency Medicine | Admitting: Emergency Medicine

## 2023-01-14 ENCOUNTER — Encounter (HOSPITAL_COMMUNITY): Payer: Self-pay | Admitting: Emergency Medicine

## 2023-01-14 DIAGNOSIS — W010XXA Fall on same level from slipping, tripping and stumbling without subsequent striking against object, initial encounter: Secondary | ICD-10-CM | POA: Insufficient documentation

## 2023-01-14 DIAGNOSIS — R079 Chest pain, unspecified: Secondary | ICD-10-CM | POA: Diagnosis not present

## 2023-01-14 DIAGNOSIS — R531 Weakness: Secondary | ICD-10-CM | POA: Diagnosis not present

## 2023-01-14 DIAGNOSIS — J9811 Atelectasis: Secondary | ICD-10-CM | POA: Diagnosis not present

## 2023-01-14 DIAGNOSIS — S0990XA Unspecified injury of head, initial encounter: Secondary | ICD-10-CM | POA: Insufficient documentation

## 2023-01-14 DIAGNOSIS — M549 Dorsalgia, unspecified: Secondary | ICD-10-CM | POA: Diagnosis not present

## 2023-01-14 DIAGNOSIS — M25521 Pain in right elbow: Secondary | ICD-10-CM | POA: Diagnosis not present

## 2023-01-14 DIAGNOSIS — W19XXXA Unspecified fall, initial encounter: Secondary | ICD-10-CM | POA: Diagnosis not present

## 2023-01-14 DIAGNOSIS — M25561 Pain in right knee: Secondary | ICD-10-CM | POA: Diagnosis not present

## 2023-01-14 DIAGNOSIS — T07XXXA Unspecified multiple injuries, initial encounter: Secondary | ICD-10-CM | POA: Diagnosis not present

## 2023-01-14 DIAGNOSIS — S8991XA Unspecified injury of right lower leg, initial encounter: Secondary | ICD-10-CM | POA: Diagnosis not present

## 2023-01-14 DIAGNOSIS — S199XXA Unspecified injury of neck, initial encounter: Secondary | ICD-10-CM | POA: Diagnosis not present

## 2023-01-14 LAB — COMPREHENSIVE METABOLIC PANEL
ALT: 20 U/L (ref 0–44)
AST: 20 U/L (ref 15–41)
Albumin: 3.7 g/dL (ref 3.5–5.0)
Alkaline Phosphatase: 69 U/L (ref 38–126)
Anion gap: 11 (ref 5–15)
BUN: 17 mg/dL (ref 8–23)
CO2: 24 mmol/L (ref 22–32)
Calcium: 9.2 mg/dL (ref 8.9–10.3)
Chloride: 105 mmol/L (ref 98–111)
Creatinine, Ser: 0.8 mg/dL (ref 0.44–1.00)
GFR, Estimated: >60 mL/min (ref >60)
Glucose, Bld: 103 mg/dL — ABNORMAL HIGH (ref 70–99)
Potassium: 3.8 mmol/L (ref 3.5–5.1)
Sodium: 140 mmol/L (ref 135–145)
Total Bilirubin: 0.5 mg/dL (ref 0.3–1.2)
Total Protein: 6 g/dL — ABNORMAL LOW (ref 6.5–8.1)

## 2023-01-14 LAB — CBC WITH DIFFERENTIAL/PLATELET
Abs Immature Granulocytes: 0.02 10*3/uL (ref 0.00–0.07)
Basophils Absolute: 0 10*3/uL (ref 0.0–0.1)
Basophils Relative: 1 %
Eosinophils Absolute: 0.1 10*3/uL (ref 0.0–0.5)
Eosinophils Relative: 1 %
HCT: 40.5 % (ref 36.0–46.0)
Hemoglobin: 12.8 g/dL (ref 12.0–15.0)
Immature Granulocytes: 0 %
Lymphocytes Relative: 23 %
Lymphs Abs: 1.7 10*3/uL (ref 0.7–4.0)
MCH: 28.2 pg (ref 26.0–34.0)
MCHC: 31.6 g/dL (ref 30.0–36.0)
MCV: 89.2 fL (ref 80.0–100.0)
Monocytes Absolute: 0.5 10*3/uL (ref 0.1–1.0)
Monocytes Relative: 7 %
Neutro Abs: 4.9 10*3/uL (ref 1.7–7.7)
Neutrophils Relative %: 68 %
Platelets: 286 10*3/uL (ref 150–400)
RBC: 4.54 MIL/uL (ref 3.87–5.11)
RDW: 13.8 % (ref 11.5–15.5)
WBC: 7.2 10*3/uL (ref 4.0–10.5)
nRBC: 0 % (ref 0.0–0.2)

## 2023-01-14 NOTE — ED Triage Notes (Signed)
Per [facility] pt had mechanical fall and bystanders called for help. Pain to right elbow, right knee and mid back. C-collar in place. Patient slow to respond but able to answer all questions appropriately. Patient states she was walking her dog when she fell but unsure if she hit her head.

## 2023-01-15 NOTE — ED Provider Notes (Signed)
EMERGENCY DEPARTMENT AT [facility] Provider Note  CSN: [id number] Arrival date & time: 01/14/23 1733  Chief Complaint(s) Fall  HPI Kendra Flynn is a 72 y.o. female who presents emergency room for evaluation of a fall.  Patient states that she was getting ready to walk her dog when she tripped and fell.  Patient found on the ground by bystanders and EMS was called.  Patient arrives slow to answer questions but alert and oriented.  Arrives with complaints to the right elbow, knee and neck.  Denies chest pain, shortness of breath, abdominal pain, nausea, vomiting, numbness, tingling, weakness or other neurologic or traumatic complaints.  Patient arrives in c-collar.   Past Medical History History reviewed. No pertinent past medical history. Patient Active Problem List   Diagnosis Date Noted   Posterior vitreous detachment of right eye 06/23/2020   History of retinal detachment 06/23/2020   Pseudophakia 06/23/2020   Primary open angle glaucoma of both eyes, moderate stage 06/23/2020   ANXIETY 07/17/2007   SUPRAVENTRICULAR TACHYCARDIA 07/17/2007   ALLERGIC RHINITIS 07/17/2007   FIBROMYALGIA 07/17/2007   INSOMNIA 07/17/2007   FATIGUE, CHRONIC 07/17/2007   HIATAL HERNIA, HX OF 07/17/2007   Home Medication(s) Prior to Admission medications   Medication Sig Start Date End Date Taking? Authorizing Provider  buPROPion (WELLBUTRIN SR) 150 MG 12 hr tablet Take 150 mg by mouth daily.    [provider]  dorzolamide-timolol (COSOPT) 22.3-6.8 MG/ML ophthalmic solution  05/26/20   [provider]  DULoxetine (CYMBALTA) 60 MG capsule Take 60 mg by mouth daily.    [provider]  latanoprost (XALATAN) 0.005 % ophthalmic solution  05/26/20   [provider]  oxyCODONE-acetaminophen (PERCOCET/ROXICET) 5-325 MG per tablet Take 1-2 tablets by mouth every 4 (four) hours as needed for severe pain. 10/23/13   Raeford Razor, MD                                                                                                                                     Past Surgical History Past Surgical History:  Procedure Laterality Date   CATARACT EXTRACTION     left eye 01/16/12   RETINAL DETACHMENT SURGERY     left eye 05/27/11   TONSILLECTOMY     1965   Family History Family History  Problem Relation Age of Onset   Colon cancer Neg Hx    Pancreatic cancer Neg Hx    Rectal cancer Neg Hx    Stomach cancer Neg Hx     Social History Social History   Tobacco Use   Smoking status: Never   Smokeless tobacco: Never  Substance Use Topics   Alcohol use: No   Drug use: No   Allergies Amoxicillin  Review of Systems Review of Systems  Musculoskeletal:  Positive for arthralgias and myalgias.    Physical Exam Vital Signs  I have reviewed the triage vital signs  BP 117/66 (BP Location: Right Arm)   Pulse 61   Temp 97.9 F (36.6 C) (Oral)   Resp 16   Ht 5\' 5"  (1.651 m)   Wt 83.9 kg   SpO2 99%   BMI 30.79 kg/m   Physical Exam Vitals and nursing note reviewed.  Constitutional:      General: She is not in acute distress.    Appearance: She is well-developed.  HENT:     Head: Normocephalic and atraumatic.  Eyes:     Conjunctiva/sclera: Conjunctivae normal.  Cardiovascular:     Rate and Rhythm: Normal rate and regular rhythm.     Heart sounds: No murmur heard. Pulmonary:     Effort: Pulmonary effort is normal. No respiratory distress.     Breath sounds: Normal breath sounds.  Abdominal:     Palpations: Abdomen is soft.     Tenderness: There is no abdominal tenderness.  Musculoskeletal:        General: Tenderness present. No swelling.     Cervical back: Neck supple.  Skin:    General: Skin is warm and dry.     Capillary Refill: Capillary refill takes less than 2 seconds.  Neurological:     Mental Status: She is alert.  Psychiatric:        Mood and Affect: Mood normal.     ED Results and Treatments Labs (all  labs ordered are listed, but only abnormal results are displayed) Labs Reviewed  COMPREHENSIVE METABOLIC PANEL - Abnormal; Notable for the following components:      Result Value   Glucose, Bld 103 (*)    Total Protein 6.0 (*)    All other components within normal limits  CBC WITH DIFFERENTIAL/PLATELET                                                                                                                          Radiology CT HEAD WO CONTRAST ( )  Result Date: 01/14/2023 CLINICAL DATA:  Trauma EXAM: CT HEAD WITHOUT CONTRAST CT CERVICAL SPINE WITHOUT CONTRAST TECHNIQUE: Multidetector CT imaging of the head and cervical spine was performed following the standard protocol without intravenous contrast. Multiplanar CT image reconstructions of the cervical spine were also generated. RADIATION DOSE REDUCTION: This exam was performed according to the departmental dose-optimization program which includes automated exposure control, adjustment of the mA and/or kV according to patient size and/or use of iterative reconstruction technique. COMPARISON:  CT Head and C Spine 05/08/21 FINDINGS: CT HEAD FINDINGS Brain: No evidence of acute infarction, hemorrhage, hydrocephalus, extra-axial collection or mass lesion/mass effect. Vascular: No hyperdense vessel or unexpected calcification. Skull: Normal. Negative for fracture or focal lesion. Sinuses/Orbits: No middle ear or mastoid effusion. Paranasal sinuses are clear. Bilateral lens replacement and scleral buckle on the left. Orbits are otherwise unremarkable. Other: None. CT CERVICAL SPINE FINDINGS Alignment: Normal. Skull base and vertebrae: No acute fracture. No primary bone lesion or focal pathologic process. Soft tissues and spinal canal: No prevertebral fluid  or swelling. No visible canal hematoma. Disc levels:  No evidence of high-grade spinal canal stenosis. Upper chest: Mild interlobular septal thickening, which can be seen in the setting of pulmonary  edema. Other: None IMPRESSION: 1. No acute intracranial abnormality. 2. No acute fracture or traumatic malalignment of the cervical spine. 3. Mild interlobular septal thickening, which can be seen in the setting of pulmonary edema. Electronically Signed   By: Lorenza Cambridge M.D.   On: 01/14/2023 20:07   CT Cervical Spine Wo Contrast  Result Date: 01/14/2023 CLINICAL DATA:  Trauma EXAM: CT HEAD WITHOUT CONTRAST CT CERVICAL SPINE WITHOUT CONTRAST TECHNIQUE: Multidetector CT imaging of the head and cervical spine was performed following the standard protocol without intravenous contrast. Multiplanar CT image reconstructions of the cervical spine were also generated. RADIATION DOSE REDUCTION: This exam was performed according to the departmental dose-optimization program which includes automated exposure control, adjustment of the mA and/or kV according to patient size and/or use of iterative reconstruction technique. COMPARISON:  CT Head and C Spine 05/08/21 FINDINGS: CT HEAD FINDINGS Brain: No evidence of acute infarction, hemorrhage, hydrocephalus, extra-axial collection or mass lesion/mass effect. Vascular: No hyperdense vessel or unexpected calcification. Skull: Normal. Negative for fracture or focal lesion. Sinuses/Orbits: No middle ear or mastoid effusion. Paranasal sinuses are clear. Bilateral lens replacement and scleral buckle on the left. Orbits are otherwise unremarkable. Other: None. CT CERVICAL SPINE FINDINGS Alignment: Normal. Skull base and vertebrae: No acute fracture. No primary bone lesion or focal pathologic process. Soft tissues and spinal canal: No prevertebral fluid or swelling. No visible canal hematoma. Disc levels:  No evidence of high-grade spinal canal stenosis. Upper chest: Mild interlobular septal thickening, which can be seen in the setting of pulmonary edema. Other: None IMPRESSION: 1. No acute intracranial abnormality. 2. No acute fracture or traumatic malalignment of the cervical  spine. 3. Mild interlobular septal thickening, which can be seen in the setting of pulmonary edema. Electronically Signed   By: Lorenza Cambridge M.D.   On: 01/14/2023 20:07   DG Knee Complete 4 Views Right  Result Date: 01/14/2023 CLINICAL DATA:  Fall, right knee injury EXAM: RIGHT KNEE - COMPLETE 4+ VIEW COMPARISON:  None Available. FINDINGS: No evidence of fracture, dislocation, or joint effusion. No evidence of arthropathy or other focal bone abnormality. Soft tissues are unremarkable. IMPRESSION: Negative. Electronically Signed   By: Helyn Numbers M.D.   On: 01/14/2023 19:03   DG Elbow 2 Views Right  Result Date: 01/14/2023 CLINICAL DATA:  Pain after fall EXAM: RIGHT ELBOW - 2 VIEW COMPARISON:  None Available. FINDINGS: No obvious fracture or dislocation. Preserved joint spaces and bone mineralization. No obvious joint effusion but the lateral view is slightly rotated limiting evaluation. IMPRESSION: No acute osseous abnormality of the limited lateral view due to rotation. A repeat lateral view could be considered as clinically appropriate to better defined for subtle joint effusion Electronically Signed   By: Karen Kays M.D.   On: 01/14/2023 19:03   DG Chest Portable 1 View  Result Date: 01/14/2023 CLINICAL DATA:  Pain after ground level fall EXAM: PORTABLE CHEST 1 VIEW COMPARISON:  Chest x-ray 10/23/2013 FINDINGS: Tortuous ectatic aorta. Mild left basilar scar or atelectasis. No consolidation, pneumothorax, effusion or edema. Artifact from the patient's clothing IMPRESSION: Slight left lung base atelectasis.  No pneumothorax or effusion Electronically Signed   By: Karen Kays M.D.   On: 01/14/2023 19:02    Pertinent labs & imaging results that were available during my care  of the patient were reviewed by me and considered in my medical decision making (see MDM for details).  Medications Ordered in ED Medications - No data to display                                                                                                                                    Procedures Procedures  (including critical care time)  Medical Decision Making / ED Course   This patient presents to the ED for concern of fall, this involves an extensive number of treatment options, and is a complaint that carries with it a high risk of complications and morbidity.  The differential diagnosis includes fracture, ligamentous injury, hematoma, contusion, dislocation, intracranial bleed, closed head injury, concussion  MDM: Patient seen emergency room for evaluation of a fall.  Physical exam with tenderness to the right elbow, right knee but is otherwise unremarkable.  Laboratory evaluation unremarkable.  Trauma imaging including CT head, C-spine, x-ray of the knee elbow and chest reassuring with no acute fractures.  Patient able to ambulate without difficulty on reevaluation and C-spine cleared.  With negative trauma workup, patient safe for discharge with outpatient follow-up.   Additional history obtained:  -External records from outside source obtained and reviewed including: Chart review including previous notes, labs, imaging, consultation notes   Lab Tests: -I ordered, reviewed, and interpreted labs.   The pertinent results include:   Labs Reviewed  COMPREHENSIVE METABOLIC PANEL - Abnormal; Notable for the following components:      Result Value   Glucose, Bld 103 (*)    Total Protein 6.0 (*)    All other components within normal limits  CBC WITH DIFFERENTIAL/PLATELET        Imaging Studies ordered: I ordered imaging studies including CT head, C-spine, x-ray knee, elbow, chest I independently visualized and interpreted imaging. I agree with the radiologist interpretation   Medicines ordered and prescription drug management: No orders of the defined types were placed in this encounter.   -I have reviewed the patients home medicines and have made adjustments as needed  Critical  interventions none    Cardiac Monitoring: The patient was maintained on a cardiac monitor.  I personally viewed and interpreted the cardiac monitored which showed an underlying rhythm of: NSR  Social Determinants of Health:  Factors impacting patients care include: none   Reevaluation: After the interventions noted above, I reevaluated the patient and found that they have :improved  Co morbidities that complicate the patient evaluation History reviewed. No pertinent past medical history.    Dispostion: I considered admission for this patient, but at this time she does not meet inpatient criteria for admission she is safe for discharge with outpatient follow-up.     Final Clinical Impression(s) / ED Diagnoses Final diagnoses:  Fall, initial encounter     @    Linc Renne, [place], MD 01/15/23 [phone]

## 2023-01-25 DIAGNOSIS — Z0000 Encounter for general adult medical examination without abnormal findings: Secondary | ICD-10-CM | POA: Diagnosis not present

## 2023-01-25 DIAGNOSIS — I8390 Asymptomatic varicose veins of unspecified lower extremity: Secondary | ICD-10-CM | POA: Diagnosis not present

## 2023-01-25 DIAGNOSIS — H401132 Primary open-angle glaucoma, bilateral, moderate stage: Secondary | ICD-10-CM | POA: Diagnosis not present

## 2023-01-25 DIAGNOSIS — E7800 Pure hypercholesterolemia, unspecified: Secondary | ICD-10-CM | POA: Diagnosis not present

## 2023-01-25 DIAGNOSIS — Z1331 Encounter for screening for depression: Secondary | ICD-10-CM | POA: Diagnosis not present

## 2023-01-25 DIAGNOSIS — Z9181 History of falling: Secondary | ICD-10-CM | POA: Diagnosis not present

## 2023-03-09 DIAGNOSIS — H5340 Unspecified visual field defects: Secondary | ICD-10-CM | POA: Diagnosis not present

## 2023-03-09 DIAGNOSIS — H31003 Unspecified chorioretinal scars, bilateral: Secondary | ICD-10-CM | POA: Diagnosis not present

## 2023-03-09 DIAGNOSIS — H401232 Low-tension glaucoma, bilateral, moderate stage: Secondary | ICD-10-CM | POA: Diagnosis not present

## 2023-06-26 ENCOUNTER — Encounter (INDEPENDENT_AMBULATORY_CARE_PROVIDER_SITE_OTHER): Payer: Medicare PPO | Admitting: Ophthalmology

## 2023-07-11 DIAGNOSIS — H31002 Unspecified chorioretinal scars, left eye: Secondary | ICD-10-CM | POA: Diagnosis not present

## 2023-07-11 DIAGNOSIS — H401132 Primary open-angle glaucoma, bilateral, moderate stage: Secondary | ICD-10-CM | POA: Diagnosis not present

## 2023-07-18 DIAGNOSIS — Z9889 Other specified postprocedural states: Secondary | ICD-10-CM | POA: Diagnosis not present

## 2023-07-18 DIAGNOSIS — H43813 Vitreous degeneration, bilateral: Secondary | ICD-10-CM | POA: Diagnosis not present

## 2023-07-18 DIAGNOSIS — H401132 Primary open-angle glaucoma, bilateral, moderate stage: Secondary | ICD-10-CM | POA: Diagnosis not present

## 2023-08-04 ENCOUNTER — Encounter: Payer: Self-pay | Admitting: Gastroenterology

## 2023-11-07 DIAGNOSIS — H401232 Low-tension glaucoma, bilateral, moderate stage: Secondary | ICD-10-CM | POA: Diagnosis not present

## 2024-02-20 ENCOUNTER — Other Ambulatory Visit (HOSPITAL_BASED_OUTPATIENT_CLINIC_OR_DEPARTMENT_OTHER): Payer: Self-pay | Admitting: Family Medicine

## 2024-02-20 DIAGNOSIS — E7800 Pure hypercholesterolemia, unspecified: Secondary | ICD-10-CM | POA: Diagnosis not present

## 2024-02-20 DIAGNOSIS — Z1331 Encounter for screening for depression: Secondary | ICD-10-CM | POA: Diagnosis not present

## 2024-02-20 DIAGNOSIS — M810 Age-related osteoporosis without current pathological fracture: Secondary | ICD-10-CM | POA: Diagnosis not present

## 2024-02-20 DIAGNOSIS — Z8639 Personal history of other endocrine, nutritional and metabolic disease: Secondary | ICD-10-CM | POA: Diagnosis not present

## 2024-02-20 DIAGNOSIS — Z0000 Encounter for general adult medical examination without abnormal findings: Secondary | ICD-10-CM | POA: Diagnosis not present

## 2024-02-20 DIAGNOSIS — Z1239 Encounter for other screening for malignant neoplasm of breast: Secondary | ICD-10-CM | POA: Diagnosis not present

## 2024-02-20 DIAGNOSIS — Z1211 Encounter for screening for malignant neoplasm of colon: Secondary | ICD-10-CM | POA: Diagnosis not present

## 2024-02-20 DIAGNOSIS — E2839 Other primary ovarian failure: Secondary | ICD-10-CM

## 2024-02-22 ENCOUNTER — Other Ambulatory Visit (HOSPITAL_BASED_OUTPATIENT_CLINIC_OR_DEPARTMENT_OTHER): Payer: Self-pay | Admitting: Family Medicine

## 2024-02-22 DIAGNOSIS — Z1239 Encounter for other screening for malignant neoplasm of breast: Secondary | ICD-10-CM

## 2024-03-28 DIAGNOSIS — H401232 Low-tension glaucoma, bilateral, moderate stage: Secondary | ICD-10-CM | POA: Diagnosis not present

## 2024-03-28 DIAGNOSIS — H5340 Unspecified visual field defects: Secondary | ICD-10-CM | POA: Diagnosis not present

## 2024-07-18 DIAGNOSIS — H35352 Cystoid macular degeneration, left eye: Secondary | ICD-10-CM | POA: Diagnosis not present

## 2024-07-18 DIAGNOSIS — Z9889 Other specified postprocedural states: Secondary | ICD-10-CM | POA: Diagnosis not present

## 2024-07-18 DIAGNOSIS — H43813 Vitreous degeneration, bilateral: Secondary | ICD-10-CM | POA: Diagnosis not present

## 2024-07-18 DIAGNOSIS — H401132 Primary open-angle glaucoma, bilateral, moderate stage: Secondary | ICD-10-CM | POA: Diagnosis not present

## 2024-07-30 DIAGNOSIS — H401232 Low-tension glaucoma, bilateral, moderate stage: Secondary | ICD-10-CM | POA: Diagnosis not present
